# Patient Record
Sex: Female | Born: 1935
Health system: Southern US, Community
[De-identification: ages and names within clinical notes are randomized; demographics above are authoritative.]

## PROBLEM LIST (undated history)

## (undated) DIAGNOSIS — I10 Essential (primary) hypertension: Secondary | ICD-10-CM

## (undated) HISTORY — PX: ABDOMINAL HYSTERECTOMY: SHX81

---

## 1998-04-10 ENCOUNTER — Ambulatory Visit (HOSPITAL_COMMUNITY): Admission: RE | Admit: 1998-04-10 | Discharge: 1998-04-10 | Payer: Self-pay | Admitting: Podiatry

## 1998-11-13 ENCOUNTER — Encounter: Payer: Self-pay | Admitting: Gastroenterology

## 1998-11-13 ENCOUNTER — Ambulatory Visit (HOSPITAL_COMMUNITY): Admission: RE | Admit: 1998-11-13 | Discharge: 1998-11-13 | Payer: Self-pay | Admitting: Gastroenterology

## 1999-07-19 ENCOUNTER — Other Ambulatory Visit: Admission: RE | Admit: 1999-07-19 | Discharge: 1999-07-19 | Payer: Self-pay | Admitting: Obstetrics and Gynecology

## 2000-02-15 ENCOUNTER — Ambulatory Visit (HOSPITAL_COMMUNITY): Admission: RE | Admit: 2000-02-15 | Discharge: 2000-02-15 | Payer: Self-pay

## 2000-02-15 ENCOUNTER — Encounter: Payer: Self-pay | Admitting: Gastroenterology

## 2001-09-24 ENCOUNTER — Ambulatory Visit (HOSPITAL_COMMUNITY): Admission: RE | Admit: 2001-09-24 | Discharge: 2001-09-24 | Payer: Self-pay | Admitting: Gastroenterology

## 2001-09-24 ENCOUNTER — Encounter: Payer: Self-pay | Admitting: Obstetrics and Gynecology

## 2001-10-20 ENCOUNTER — Other Ambulatory Visit: Admission: RE | Admit: 2001-10-20 | Discharge: 2001-10-20 | Payer: Self-pay | Admitting: Obstetrics and Gynecology

## 2002-10-29 ENCOUNTER — Encounter: Payer: Self-pay | Admitting: Obstetrics and Gynecology

## 2002-10-29 ENCOUNTER — Ambulatory Visit (HOSPITAL_COMMUNITY): Admission: RE | Admit: 2002-10-29 | Discharge: 2002-10-29 | Payer: Self-pay | Admitting: Obstetrics and Gynecology

## 2003-12-01 ENCOUNTER — Ambulatory Visit (HOSPITAL_COMMUNITY): Admission: RE | Admit: 2003-12-01 | Discharge: 2003-12-01 | Payer: Self-pay | Admitting: Obstetrics and Gynecology

## 2004-12-05 ENCOUNTER — Ambulatory Visit (HOSPITAL_COMMUNITY): Admission: RE | Admit: 2004-12-05 | Discharge: 2004-12-05 | Payer: Self-pay | Admitting: Obstetrics and Gynecology

## 2005-12-25 ENCOUNTER — Ambulatory Visit (HOSPITAL_COMMUNITY): Admission: RE | Admit: 2005-12-25 | Discharge: 2005-12-25 | Payer: Self-pay | Admitting: Obstetrics and Gynecology

## 2006-01-15 ENCOUNTER — Encounter: Admission: RE | Admit: 2006-01-15 | Discharge: 2006-01-15 | Payer: Self-pay | Admitting: Internal Medicine

## 2007-03-09 ENCOUNTER — Ambulatory Visit (HOSPITAL_COMMUNITY): Admission: RE | Admit: 2007-03-09 | Discharge: 2007-03-09 | Payer: Self-pay | Admitting: Obstetrics and Gynecology

## 2008-03-28 ENCOUNTER — Ambulatory Visit (HOSPITAL_COMMUNITY): Admission: RE | Admit: 2008-03-28 | Discharge: 2008-03-28 | Payer: Self-pay | Admitting: Obstetrics and Gynecology

## 2009-03-30 ENCOUNTER — Ambulatory Visit (HOSPITAL_COMMUNITY): Admission: RE | Admit: 2009-03-30 | Discharge: 2009-03-30 | Payer: Self-pay | Admitting: Obstetrics and Gynecology

## 2009-04-06 ENCOUNTER — Encounter: Admission: RE | Admit: 2009-04-06 | Discharge: 2009-04-06 | Payer: Self-pay | Admitting: Obstetrics and Gynecology

## 2010-05-17 ENCOUNTER — Ambulatory Visit (HOSPITAL_COMMUNITY): Admission: RE | Admit: 2010-05-17 | Discharge: 2010-05-17 | Payer: Self-pay | Admitting: Obstetrics and Gynecology

## 2011-01-20 ENCOUNTER — Encounter: Payer: Self-pay | Admitting: Internal Medicine

## 2011-01-20 ENCOUNTER — Encounter: Payer: Self-pay | Admitting: Obstetrics and Gynecology

## 2011-05-06 ENCOUNTER — Other Ambulatory Visit (HOSPITAL_COMMUNITY): Payer: Self-pay | Admitting: Obstetrics and Gynecology

## 2011-05-06 DIAGNOSIS — Z1231 Encounter for screening mammogram for malignant neoplasm of breast: Secondary | ICD-10-CM

## 2011-05-20 ENCOUNTER — Ambulatory Visit (HOSPITAL_COMMUNITY)
Admission: RE | Admit: 2011-05-20 | Discharge: 2011-05-20 | Disposition: A | Discharge status: Home / Self Care | Payer: PRIVATE HEALTH INSURANCE | Source: Ambulatory Visit | Attending: Obstetrics and Gynecology | Admitting: Obstetrics and Gynecology

## 2011-05-20 DIAGNOSIS — Z1231 Encounter for screening mammogram for malignant neoplasm of breast: Secondary | ICD-10-CM | POA: Insufficient documentation

## 2011-05-22 ENCOUNTER — Other Ambulatory Visit: Payer: Self-pay | Admitting: Internal Medicine

## 2011-05-22 DIAGNOSIS — N6320 Unspecified lump in the left breast, unspecified quadrant: Secondary | ICD-10-CM

## 2011-05-28 ENCOUNTER — Ambulatory Visit
Admission: RE | Admit: 2011-05-28 | Discharge: 2011-05-28 | Disposition: A | Discharge status: Home / Self Care | Payer: Medicare Other | Source: Ambulatory Visit | Attending: Internal Medicine | Admitting: Internal Medicine

## 2011-05-28 DIAGNOSIS — N6320 Unspecified lump in the left breast, unspecified quadrant: Secondary | ICD-10-CM

## 2011-07-23 ENCOUNTER — Ambulatory Visit (INDEPENDENT_AMBULATORY_CARE_PROVIDER_SITE_OTHER): Payer: Medicare Other | Admitting: Sports Medicine

## 2011-07-23 ENCOUNTER — Encounter: Payer: Self-pay | Admitting: Sports Medicine

## 2011-07-23 VITALS — BP 125/82 | HR 67 | Ht 64.0 in | Wt 108.0 lb

## 2011-07-23 DIAGNOSIS — M79673 Pain in unspecified foot: Secondary | ICD-10-CM | POA: Insufficient documentation

## 2011-07-23 DIAGNOSIS — M79609 Pain in unspecified limb: Secondary | ICD-10-CM

## 2011-07-23 NOTE — Patient Instructions (Signed)
Great to meet you, You have an arch strain. Arch straps. Insoles. Ice the area 20 mins 3x a day. Do heel lifts as we discussed after you return from [place], 15 reps each with toes out, toes, neutral, and toes in.  Do this once daily. Come back to see [place] if no better after 1 month of your rehab exercises. Drs. Fields and [employer]

## 2011-07-23 NOTE — Assessment & Plan Note (Signed)
Arch straps. Sports insoles into both shoes. She will ice after walking. Rehabilitation exercises as below in the instructions to be started when the patient returns from [place]. We will see her back on an as-needed basis.

## 2011-07-23 NOTE — Progress Notes (Signed)
  Subjective:    Patient ID: Hannah Sims, female    DOB: 1936-01-07, 75 y.o.   MRN: [id number]  HPI Hannah Sims is a prolific 24 mile per week walker who comes to see [place] for right foot pain for approximately 3 weeks now. She notes that 3 weeks ago she went [place] the beach. She did not have immediate pain but over the next 2-3 weeks the pain developed both just anterior to her heel as well as in the middle of her arch medially. She denies any known trauma, however the pain is much worse with walking. She notes that icing after the walks are not all that helpful. She notes no other change in training, environment, walking surface, or footwear.   Review of Systems    thoroughly reviewed and negative except as above in history of present illness. Objective:   Physical Exam    general: Well-developed well-nourished Caucasian female. Right Ankle: No visible erythema or swelling. Range of motion is full in all directions. Strength is 5/5 in all directions. Stable lateral and medial ligaments; squeeze test and kleiger test unremarkable; Talar dome nontender; No pain at base of 5th MT; No tenderness over cuboid; No tenderness on posterior aspects of lateral and medial malleolus No sign of peroneal tendon subluxations or tenderness to palpation Negative tarsal tunnel tinel's Able to walk 4 steps. She does have some tenderness at the calcaneal insertion of the plantar fascia, as well as in the middle of the arch over the navicular. Pes cavus noted. There was no breakdown of the transverse arch.  Musculoskeletal ultrasound: We imaged the plantar fascia from its origin to its insertion. We did note some edema deep to the quadratus plantae muscle. The plantar fascia itself was of normal thickness and was comparable to the lateral side. There was no increased Doppler flow at the calcaneus or navicular. Images saved  Assessment & Plan:

## 2012-01-20 DIAGNOSIS — L570 Actinic keratosis: Secondary | ICD-10-CM | POA: Diagnosis not present

## 2012-06-04 ENCOUNTER — Encounter: Payer: Self-pay | Admitting: Sports Medicine

## 2012-06-04 ENCOUNTER — Ambulatory Visit (INDEPENDENT_AMBULATORY_CARE_PROVIDER_SITE_OTHER): Payer: Medicare Other | Admitting: Sports Medicine

## 2012-06-04 VITALS — BP 120/80 | Ht 64.0 in | Wt 110.0 lb

## 2012-06-04 DIAGNOSIS — M545 Low back pain, unspecified: Secondary | ICD-10-CM | POA: Diagnosis not present

## 2012-06-04 DIAGNOSIS — M19049 Primary osteoarthritis, unspecified hand: Secondary | ICD-10-CM | POA: Diagnosis not present

## 2012-06-04 DIAGNOSIS — M542 Cervicalgia: Secondary | ICD-10-CM

## 2012-06-04 MED ORDER — TRAMADOL HCL 50 MG PO TABS: 50.0000 mg | ORAL_TABLET | Freq: Three times a day (TID) | ORAL | Status: AC | PRN

## 2012-06-04 NOTE — Patient Instructions (Signed)
Back extension exercises superman, and bridge. Neck isometric strengthening. Tramadol 50 mg oral every 8 hours as needed.

## 2012-06-04 NOTE — Progress Notes (Signed)
  Subjective:    Patient ID: Hannah Sims, female    DOB: August 09, 1936, 76 y.o.   MRN: [id number]  HPI  Ms. Klos is coming complaining of LBP for 1 year or more, no radiated, sharp, no numbness or tingling, no injury to her LBP. Neck pain, 2/10, on and off, sharp, no radiated, no numbness and tingling. No injury to her neck  Left hand  CMC DJD, chronic pain on the [facility] joint, sharp,2/10, .  No injury to her CMC. She is doing yoga and working out in [employer] Also walking daily for exercises. She is taking vit d, Ca, glucosamine and fish oil  Patient Active Problem List  Diagnoses  . Arch strain     Current Outpatient Prescriptions on File Prior to Visit  Medication Sig Dispense Refill  . calcium carbonate 200 MG capsule Take 250 mg by mouth daily.        Marland Kitchen lisinopril-hydrochlorothiazide (PRINZIDE,ZESTORETIC) 10-12.5 MG per tablet Take 10-12.5 mg by mouth daily.      . Multiple Vitamin (MULTIVITAMIN) tablet Take 1 tablet by mouth daily.        Marland Kitchen VITAMIN D, CHOLECALCIFEROL, PO Take 1 tablet by mouth daily.        Marland Kitchen VIVELLE-DOT 0.025 MG/24HR Place 0.025 mg onto the skin 2 (two) times a week.       No Known Allergies   Review of Systems  Constitutional: Negative for fever, chills, diaphoresis and fatigue.  Musculoskeletal: Positive for back pain. Negative for joint swelling, arthralgias and gait problem.  Neurological: Negative for weakness and numbness.       Objective:   Physical Exam  Constitutional: She appears well-developed and well-nourished.       BP 120/80  Ht 5\' 4"  (1.626 m)  Wt 110 lb (49.896 kg)  BMI 18.88 kg/m2   Neck: Normal range of motion. Neck supple. No thyromegaly present.       Neck  with intact skin. No swelling, no hematomas. FROM  for flexion, extension, rotation and lateralization with pain at the extremes.  Tenderness to palpation in the posterior aspect of her neck. Strength 5/5 for shoulder flexion and extension, 5/5 for elbow flexion and  extension, 5/5 for wrist flexion and extension B/L. DTR biceps,triceps and brachioradialis II/IV B/L. Sensation intact distally B/L.    Pulmonary/Chest: Effort normal.  Musculoskeletal:       Low back with intact skin. No swelling, no hematomas. FROM for flexion, extension, rotation and lateralization. Pain at the extremes of flexion and extension. No Tenderness to palpation on SI joint area B/L. Faber test negative for SI joint pain. Straight leg raise negative B/L. Strength 5/5 for hip flexion and extension, 5/5 for knee flexion and extension, 5/5 for ankle plantar and dorsal flexion B/L. DTR patellar and achilles II/IV B/L Sensation intact distally B/L. No leg discrepancy.           Assessment & Plan:   1. Low back pain   2. Neck pain   3. CMC arthritis    Back extension exercises superman, and bridge. Neck isometric strengthening. Tramadol 50 mg oral every 8 hours as needed. F/U prn

## 2012-07-30 DIAGNOSIS — Z0000 Encounter for general adult medical examination without abnormal findings: Secondary | ICD-10-CM | POA: Diagnosis not present

## 2012-07-30 DIAGNOSIS — M949 Disorder of cartilage, unspecified: Secondary | ICD-10-CM | POA: Diagnosis not present

## 2012-07-30 DIAGNOSIS — Z1331 Encounter for screening for depression: Secondary | ICD-10-CM | POA: Diagnosis not present

## 2012-07-30 DIAGNOSIS — I10 Essential (primary) hypertension: Secondary | ICD-10-CM | POA: Diagnosis not present

## 2012-08-10 ENCOUNTER — Other Ambulatory Visit: Payer: Self-pay | Admitting: Internal Medicine

## 2012-08-10 DIAGNOSIS — Z1231 Encounter for screening mammogram for malignant neoplasm of breast: Secondary | ICD-10-CM

## 2012-08-27 ENCOUNTER — Ambulatory Visit
Admission: RE | Admit: 2012-08-27 | Discharge: 2012-08-27 | Disposition: A | Discharge status: Home / Self Care | Payer: PRIVATE HEALTH INSURANCE | Source: Ambulatory Visit | Attending: Internal Medicine | Admitting: Internal Medicine

## 2012-08-27 DIAGNOSIS — Z1231 Encounter for screening mammogram for malignant neoplasm of breast: Secondary | ICD-10-CM

## 2012-10-02 DIAGNOSIS — Z23 Encounter for immunization: Secondary | ICD-10-CM | POA: Diagnosis not present

## 2012-10-06 DIAGNOSIS — H1044 Vernal conjunctivitis: Secondary | ICD-10-CM | POA: Diagnosis not present

## 2012-12-28 DIAGNOSIS — L408 Other psoriasis: Secondary | ICD-10-CM | POA: Diagnosis not present

## 2012-12-28 DIAGNOSIS — Z85828 Personal history of other malignant neoplasm of skin: Secondary | ICD-10-CM | POA: Diagnosis not present

## 2013-02-22 DIAGNOSIS — L259 Unspecified contact dermatitis, unspecified cause: Secondary | ICD-10-CM | POA: Diagnosis not present

## 2013-02-22 DIAGNOSIS — L570 Actinic keratosis: Secondary | ICD-10-CM | POA: Diagnosis not present

## 2013-02-22 DIAGNOSIS — L719 Rosacea, unspecified: Secondary | ICD-10-CM | POA: Diagnosis not present

## 2013-02-22 DIAGNOSIS — Z85828 Personal history of other malignant neoplasm of skin: Secondary | ICD-10-CM | POA: Diagnosis not present

## 2013-05-11 DIAGNOSIS — L820 Inflamed seborrheic keratosis: Secondary | ICD-10-CM | POA: Diagnosis not present

## 2013-05-11 DIAGNOSIS — Z85828 Personal history of other malignant neoplasm of skin: Secondary | ICD-10-CM | POA: Diagnosis not present

## 2013-05-11 DIAGNOSIS — L570 Actinic keratosis: Secondary | ICD-10-CM | POA: Diagnosis not present

## 2013-05-11 DIAGNOSIS — L719 Rosacea, unspecified: Secondary | ICD-10-CM | POA: Diagnosis not present

## 2013-07-29 ENCOUNTER — Other Ambulatory Visit: Payer: Self-pay

## 2013-07-29 DIAGNOSIS — Z1231 Encounter for screening mammogram for malignant neoplasm of breast: Secondary | ICD-10-CM

## 2013-08-03 DIAGNOSIS — I10 Essential (primary) hypertension: Secondary | ICD-10-CM | POA: Diagnosis not present

## 2013-08-03 DIAGNOSIS — M899 Disorder of bone, unspecified: Secondary | ICD-10-CM | POA: Diagnosis not present

## 2013-08-03 DIAGNOSIS — Z23 Encounter for immunization: Secondary | ICD-10-CM | POA: Diagnosis not present

## 2013-08-03 DIAGNOSIS — M949 Disorder of cartilage, unspecified: Secondary | ICD-10-CM | POA: Diagnosis not present

## 2013-08-03 DIAGNOSIS — Z0000 Encounter for general adult medical examination without abnormal findings: Secondary | ICD-10-CM | POA: Diagnosis not present

## 2013-08-31 ENCOUNTER — Ambulatory Visit: Payer: PRIVATE HEALTH INSURANCE

## 2013-09-21 ENCOUNTER — Ambulatory Visit
Admission: RE | Admit: 2013-09-21 | Discharge: 2013-09-21 | Disposition: A | Discharge status: Home / Self Care | Payer: PRIVATE HEALTH INSURANCE | Source: Ambulatory Visit

## 2013-09-21 DIAGNOSIS — Z1231 Encounter for screening mammogram for malignant neoplasm of breast: Secondary | ICD-10-CM

## 2013-09-27 DIAGNOSIS — Z23 Encounter for immunization: Secondary | ICD-10-CM | POA: Diagnosis not present

## 2014-01-04 DIAGNOSIS — L819 Disorder of pigmentation, unspecified: Secondary | ICD-10-CM | POA: Diagnosis not present

## 2014-01-04 DIAGNOSIS — Z85828 Personal history of other malignant neoplasm of skin: Secondary | ICD-10-CM | POA: Diagnosis not present

## 2014-01-04 DIAGNOSIS — L821 Other seborrheic keratosis: Secondary | ICD-10-CM | POA: Diagnosis not present

## 2014-01-04 DIAGNOSIS — D1801 Hemangioma of skin and subcutaneous tissue: Secondary | ICD-10-CM | POA: Diagnosis not present

## 2014-04-18 DIAGNOSIS — R498 Other voice and resonance disorders: Secondary | ICD-10-CM | POA: Diagnosis not present

## 2014-06-02 DIAGNOSIS — H1044 Vernal conjunctivitis: Secondary | ICD-10-CM | POA: Diagnosis not present

## 2014-09-02 DIAGNOSIS — Z0000 Encounter for general adult medical examination without abnormal findings: Secondary | ICD-10-CM | POA: Diagnosis not present

## 2014-09-02 DIAGNOSIS — R636 Underweight: Secondary | ICD-10-CM | POA: Diagnosis not present

## 2014-09-02 DIAGNOSIS — G4700 Insomnia, unspecified: Secondary | ICD-10-CM | POA: Diagnosis not present

## 2014-09-02 DIAGNOSIS — Z1331 Encounter for screening for depression: Secondary | ICD-10-CM | POA: Diagnosis not present

## 2014-09-02 DIAGNOSIS — I10 Essential (primary) hypertension: Secondary | ICD-10-CM | POA: Diagnosis not present

## 2014-09-02 DIAGNOSIS — Z23 Encounter for immunization: Secondary | ICD-10-CM | POA: Diagnosis not present

## 2014-09-08 ENCOUNTER — Other Ambulatory Visit: Payer: Self-pay

## 2014-09-08 DIAGNOSIS — Z1231 Encounter for screening mammogram for malignant neoplasm of breast: Secondary | ICD-10-CM

## 2014-09-22 ENCOUNTER — Ambulatory Visit
Admission: RE | Admit: 2014-09-22 | Discharge: 2014-09-22 | Disposition: A | Discharge status: Home / Self Care | Payer: Managed Care, Other (non HMO) | Source: Ambulatory Visit

## 2014-09-22 DIAGNOSIS — Z1231 Encounter for screening mammogram for malignant neoplasm of breast: Secondary | ICD-10-CM

## 2014-11-03 DIAGNOSIS — M779 Enthesopathy, unspecified: Secondary | ICD-10-CM | POA: Diagnosis not present

## 2014-12-05 DIAGNOSIS — M898X7 Other specified disorders of bone, ankle and foot: Secondary | ICD-10-CM | POA: Diagnosis not present

## 2015-01-05 DIAGNOSIS — L409 Psoriasis, unspecified: Secondary | ICD-10-CM | POA: Diagnosis not present

## 2015-01-05 DIAGNOSIS — L821 Other seborrheic keratosis: Secondary | ICD-10-CM | POA: Diagnosis not present

## 2015-01-05 DIAGNOSIS — B079 Viral wart, unspecified: Secondary | ICD-10-CM | POA: Diagnosis not present

## 2015-01-05 DIAGNOSIS — Z85828 Personal history of other malignant neoplasm of skin: Secondary | ICD-10-CM | POA: Diagnosis not present

## 2015-02-27 DIAGNOSIS — H2513 Age-related nuclear cataract, bilateral: Secondary | ICD-10-CM | POA: Diagnosis not present

## 2015-09-05 ENCOUNTER — Other Ambulatory Visit: Payer: Self-pay

## 2015-09-05 DIAGNOSIS — Z1389 Encounter for screening for other disorder: Secondary | ICD-10-CM | POA: Diagnosis not present

## 2015-09-05 DIAGNOSIS — E559 Vitamin D deficiency, unspecified: Secondary | ICD-10-CM | POA: Diagnosis not present

## 2015-09-05 DIAGNOSIS — I10 Essential (primary) hypertension: Secondary | ICD-10-CM | POA: Diagnosis not present

## 2015-09-05 DIAGNOSIS — Z23 Encounter for immunization: Secondary | ICD-10-CM | POA: Diagnosis not present

## 2015-09-05 DIAGNOSIS — Z1231 Encounter for screening mammogram for malignant neoplasm of breast: Secondary | ICD-10-CM

## 2015-09-05 DIAGNOSIS — M8580 Other specified disorders of bone density and structure, unspecified site: Secondary | ICD-10-CM | POA: Diagnosis not present

## 2015-09-05 DIAGNOSIS — R636 Underweight: Secondary | ICD-10-CM | POA: Diagnosis not present

## 2015-09-26 ENCOUNTER — Ambulatory Visit
Admission: RE | Admit: 2015-09-26 | Discharge: 2015-09-26 | Disposition: A | Discharge status: Home / Self Care | Payer: Medicare Other | Source: Ambulatory Visit

## 2015-09-26 DIAGNOSIS — Z1231 Encounter for screening mammogram for malignant neoplasm of breast: Secondary | ICD-10-CM

## 2015-10-12 DIAGNOSIS — M859 Disorder of bone density and structure, unspecified: Secondary | ICD-10-CM | POA: Diagnosis not present

## 2015-10-12 DIAGNOSIS — M8589 Other specified disorders of bone density and structure, multiple sites: Secondary | ICD-10-CM | POA: Diagnosis not present

## 2015-10-23 DIAGNOSIS — S63602A Unspecified sprain of left thumb, initial encounter: Secondary | ICD-10-CM | POA: Diagnosis not present

## 2015-10-23 DIAGNOSIS — M19042 Primary osteoarthritis, left hand: Secondary | ICD-10-CM | POA: Diagnosis not present

## 2015-10-23 DIAGNOSIS — M7989 Other specified soft tissue disorders: Secondary | ICD-10-CM | POA: Diagnosis not present

## 2015-10-23 DIAGNOSIS — M79645 Pain in left finger(s): Secondary | ICD-10-CM | POA: Diagnosis not present

## 2015-10-27 DIAGNOSIS — M79641 Pain in right hand: Secondary | ICD-10-CM | POA: Diagnosis not present

## 2015-10-27 DIAGNOSIS — S63602D Unspecified sprain of left thumb, subsequent encounter: Secondary | ICD-10-CM | POA: Diagnosis not present

## 2015-10-27 DIAGNOSIS — S62515D Nondisplaced fracture of proximal phalanx of left thumb, subsequent encounter for fracture with routine healing: Secondary | ICD-10-CM | POA: Diagnosis not present

## 2015-11-13 DIAGNOSIS — M79645 Pain in left finger(s): Secondary | ICD-10-CM | POA: Diagnosis not present

## 2015-11-13 DIAGNOSIS — M1812 Unilateral primary osteoarthritis of first carpometacarpal joint, left hand: Secondary | ICD-10-CM | POA: Diagnosis not present

## 2015-11-13 DIAGNOSIS — S63602D Unspecified sprain of left thumb, subsequent encounter: Secondary | ICD-10-CM | POA: Diagnosis not present

## 2015-11-13 DIAGNOSIS — S62515D Nondisplaced fracture of proximal phalanx of left thumb, subsequent encounter for fracture with routine healing: Secondary | ICD-10-CM | POA: Diagnosis not present

## 2016-02-09 DIAGNOSIS — Z85828 Personal history of other malignant neoplasm of skin: Secondary | ICD-10-CM | POA: Diagnosis not present

## 2016-02-09 DIAGNOSIS — L570 Actinic keratosis: Secondary | ICD-10-CM | POA: Diagnosis not present

## 2016-02-09 DIAGNOSIS — L821 Other seborrheic keratosis: Secondary | ICD-10-CM | POA: Diagnosis not present

## 2016-02-09 DIAGNOSIS — D1801 Hemangioma of skin and subcutaneous tissue: Secondary | ICD-10-CM | POA: Diagnosis not present

## 2016-02-09 DIAGNOSIS — L400 Psoriasis vulgaris: Secondary | ICD-10-CM | POA: Diagnosis not present

## 2016-02-09 DIAGNOSIS — D2239 Melanocytic nevi of other parts of face: Secondary | ICD-10-CM | POA: Diagnosis not present

## 2016-03-18 DIAGNOSIS — H1011 Acute atopic conjunctivitis, right eye: Secondary | ICD-10-CM | POA: Diagnosis not present

## 2016-04-30 DIAGNOSIS — H2513 Age-related nuclear cataract, bilateral: Secondary | ICD-10-CM | POA: Diagnosis not present

## 2016-06-25 ENCOUNTER — Encounter: Payer: Self-pay | Admitting: Sports Medicine

## 2016-06-25 ENCOUNTER — Ambulatory Visit
Admission: RE | Admit: 2016-06-25 | Discharge: 2016-06-25 | Disposition: A | Discharge status: Home / Self Care | Payer: Medicare Other | Source: Ambulatory Visit | Attending: Sports Medicine | Admitting: Sports Medicine

## 2016-06-25 ENCOUNTER — Ambulatory Visit (INDEPENDENT_AMBULATORY_CARE_PROVIDER_SITE_OTHER): Payer: Managed Care, Other (non HMO) | Admitting: Sports Medicine

## 2016-06-25 VITALS — BP 121/82 | HR 62 | Ht 64.5 in | Wt 101.0 lb

## 2016-06-25 DIAGNOSIS — M1712 Unilateral primary osteoarthritis, left knee: Secondary | ICD-10-CM | POA: Diagnosis not present

## 2016-06-25 DIAGNOSIS — M47816 Spondylosis without myelopathy or radiculopathy, lumbar region: Secondary | ICD-10-CM | POA: Diagnosis not present

## 2016-06-25 DIAGNOSIS — M8580 Other specified disorders of bone density and structure, unspecified site: Secondary | ICD-10-CM | POA: Diagnosis not present

## 2016-06-25 NOTE — Assessment & Plan Note (Signed)
She has excellent strength  I think she is a good candidate for the OA study with [facility] We will enroll her in that  Avoid deep knee bend and steps  Reevaluate after 3 mos of supervised PT in study

## 2016-06-25 NOTE — Progress Notes (Signed)
Patient ID: SVANA WETTSTEIN, female   DOB: [birth year], 80 y.o.   MRN: [id number]  CC; Left Knee pain  Patient enters with some left knee pain Along medial aspect of knee she exercises with walking up to about 14000 steps per day No injury Has been present past few months No locking No giving way Sometimes feels a pain on steps or if walking too fast  Past Hx Periodic low back pain - still bothers her Osteopenia and ? 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 Palpation normal with no warmth or joint line tenderness or patellar tenderness or condyle tenderness. ROM normal in flexion and extension and lower leg rotation. Ligaments with solid consistent endpoints including ACL, PCL, LCL, MCL. Clicking and some medial joint line pain on Mcmurray's and provocative meniscal tests. Non painful patellar compression. Patellar and quadriceps tendons unremarkable. Hamstring and quadriceps strength is normal. Abduction strength is good  [place] Small effusion in lateral SPP Deg. Meniscal change with loose body med meniscus/ TT to sonopalpation Only mild joint space narrowing also noted laterally QT and PT normal

## 2016-06-25 NOTE — Patient Instructions (Signed)
Left knee pain Ultrasound you have degeneration of the medial meniscus and a small loose calcified piece of cartilage This causes  some pain along her medial knee You do have very mild osteoarthritis   excellent knee for anyone in their 26s  Osteopenia Remember the 90 sec hop test 3 sessions of 30 secs with 1 minute rest in between Increases bone density as much as walking 2 miles  Low Back Let's check to see the degree of vertebral arthritis on Xray  You are an excellent candidate for the knee research project with [facility]

## 2016-08-27 DIAGNOSIS — Z85828 Personal history of other malignant neoplasm of skin: Secondary | ICD-10-CM | POA: Diagnosis not present

## 2016-08-27 DIAGNOSIS — L821 Other seborrheic keratosis: Secondary | ICD-10-CM | POA: Diagnosis not present

## 2016-08-27 DIAGNOSIS — L820 Inflamed seborrheic keratosis: Secondary | ICD-10-CM | POA: Diagnosis not present

## 2016-08-27 DIAGNOSIS — L400 Psoriasis vulgaris: Secondary | ICD-10-CM | POA: Diagnosis not present

## 2016-09-26 DIAGNOSIS — I10 Essential (primary) hypertension: Secondary | ICD-10-CM | POA: Diagnosis not present

## 2016-09-26 DIAGNOSIS — Z1389 Encounter for screening for other disorder: Secondary | ICD-10-CM | POA: Diagnosis not present

## 2016-09-26 DIAGNOSIS — M8580 Other specified disorders of bone density and structure, unspecified site: Secondary | ICD-10-CM | POA: Diagnosis not present

## 2016-09-26 DIAGNOSIS — R636 Underweight: Secondary | ICD-10-CM | POA: Diagnosis not present

## 2016-09-26 DIAGNOSIS — F5101 Primary insomnia: Secondary | ICD-10-CM | POA: Diagnosis not present

## 2016-09-26 DIAGNOSIS — Z23 Encounter for immunization: Secondary | ICD-10-CM | POA: Diagnosis not present

## 2016-09-26 DIAGNOSIS — Z0000 Encounter for general adult medical examination without abnormal findings: Secondary | ICD-10-CM | POA: Diagnosis not present

## 2016-10-01 ENCOUNTER — Other Ambulatory Visit: Payer: Self-pay | Admitting: Internal Medicine

## 2016-10-01 DIAGNOSIS — Z1231 Encounter for screening mammogram for malignant neoplasm of breast: Secondary | ICD-10-CM

## 2016-10-08 ENCOUNTER — Ambulatory Visit: Payer: Medicare Other

## 2016-10-15 ENCOUNTER — Ambulatory Visit
Admission: RE | Admit: 2016-10-15 | Discharge: 2016-10-15 | Disposition: A | Discharge status: Home / Self Care | Payer: Medicare Other | Source: Ambulatory Visit | Attending: Internal Medicine | Admitting: Internal Medicine

## 2016-10-15 DIAGNOSIS — Z1231 Encounter for screening mammogram for malignant neoplasm of breast: Secondary | ICD-10-CM

## 2017-04-11 DIAGNOSIS — L814 Other melanin hyperpigmentation: Secondary | ICD-10-CM | POA: Diagnosis not present

## 2017-04-11 DIAGNOSIS — Z85828 Personal history of other malignant neoplasm of skin: Secondary | ICD-10-CM | POA: Diagnosis not present

## 2017-04-11 DIAGNOSIS — L400 Psoriasis vulgaris: Secondary | ICD-10-CM | POA: Diagnosis not present

## 2017-04-11 DIAGNOSIS — I8391 Asymptomatic varicose veins of right lower extremity: Secondary | ICD-10-CM | POA: Diagnosis not present

## 2017-04-11 DIAGNOSIS — L821 Other seborrheic keratosis: Secondary | ICD-10-CM | POA: Diagnosis not present

## 2017-04-11 DIAGNOSIS — D2271 Melanocytic nevi of right lower limb, including hip: Secondary | ICD-10-CM | POA: Diagnosis not present

## 2017-04-11 DIAGNOSIS — D1801 Hemangioma of skin and subcutaneous tissue: Secondary | ICD-10-CM | POA: Diagnosis not present

## 2017-04-11 DIAGNOSIS — D2239 Melanocytic nevi of other parts of face: Secondary | ICD-10-CM | POA: Diagnosis not present

## 2017-05-19 DIAGNOSIS — H1013 Acute atopic conjunctivitis, bilateral: Secondary | ICD-10-CM | POA: Diagnosis not present

## 2017-05-26 DIAGNOSIS — S90562A Insect bite (nonvenomous), left ankle, initial encounter: Secondary | ICD-10-CM | POA: Diagnosis not present

## 2017-08-06 DIAGNOSIS — I10 Essential (primary) hypertension: Secondary | ICD-10-CM | POA: Diagnosis not present

## 2017-08-06 DIAGNOSIS — M791 Myalgia: Secondary | ICD-10-CM | POA: Diagnosis not present

## 2017-08-06 DIAGNOSIS — M159 Polyosteoarthritis, unspecified: Secondary | ICD-10-CM | POA: Diagnosis not present

## 2017-09-29 DIAGNOSIS — I10 Essential (primary) hypertension: Secondary | ICD-10-CM | POA: Diagnosis not present

## 2017-09-29 DIAGNOSIS — Z23 Encounter for immunization: Secondary | ICD-10-CM | POA: Diagnosis not present

## 2017-09-29 DIAGNOSIS — Z1389 Encounter for screening for other disorder: Secondary | ICD-10-CM | POA: Diagnosis not present

## 2017-09-29 DIAGNOSIS — F5101 Primary insomnia: Secondary | ICD-10-CM | POA: Diagnosis not present

## 2017-09-29 DIAGNOSIS — Z0000 Encounter for general adult medical examination without abnormal findings: Secondary | ICD-10-CM | POA: Diagnosis not present

## 2017-09-29 DIAGNOSIS — R636 Underweight: Secondary | ICD-10-CM | POA: Diagnosis not present

## 2017-10-06 DIAGNOSIS — H2513 Age-related nuclear cataract, bilateral: Secondary | ICD-10-CM | POA: Diagnosis not present

## 2017-12-11 DIAGNOSIS — F5101 Primary insomnia: Secondary | ICD-10-CM | POA: Diagnosis not present

## 2018-01-05 DIAGNOSIS — H0012 Chalazion right lower eyelid: Secondary | ICD-10-CM | POA: Diagnosis not present

## 2018-01-13 DIAGNOSIS — L814 Other melanin hyperpigmentation: Secondary | ICD-10-CM | POA: Diagnosis not present

## 2018-01-13 DIAGNOSIS — L570 Actinic keratosis: Secondary | ICD-10-CM | POA: Diagnosis not present

## 2018-01-13 DIAGNOSIS — Z85828 Personal history of other malignant neoplasm of skin: Secondary | ICD-10-CM | POA: Diagnosis not present

## 2018-01-13 DIAGNOSIS — L821 Other seborrheic keratosis: Secondary | ICD-10-CM | POA: Diagnosis not present

## 2018-01-16 DIAGNOSIS — H0012 Chalazion right lower eyelid: Secondary | ICD-10-CM | POA: Diagnosis not present

## 2018-01-23 ENCOUNTER — Other Ambulatory Visit: Payer: Self-pay | Admitting: Internal Medicine

## 2018-01-23 ENCOUNTER — Ambulatory Visit
Admission: RE | Admit: 2018-01-23 | Discharge: 2018-01-23 | Disposition: A | Discharge status: Home / Self Care | Payer: Medicare Other | Source: Ambulatory Visit | Attending: Internal Medicine | Admitting: Internal Medicine

## 2018-01-23 DIAGNOSIS — R05 Cough: Secondary | ICD-10-CM | POA: Diagnosis not present

## 2018-01-23 DIAGNOSIS — R5383 Other fatigue: Secondary | ICD-10-CM | POA: Diagnosis not present

## 2018-01-23 DIAGNOSIS — J209 Acute bronchitis, unspecified: Secondary | ICD-10-CM | POA: Diagnosis not present

## 2018-01-23 DIAGNOSIS — I10 Essential (primary) hypertension: Secondary | ICD-10-CM | POA: Diagnosis not present

## 2018-01-23 DIAGNOSIS — J40 Bronchitis, not specified as acute or chronic: Secondary | ICD-10-CM

## 2018-01-23 DIAGNOSIS — R350 Frequency of micturition: Secondary | ICD-10-CM | POA: Diagnosis not present

## 2018-01-30 DIAGNOSIS — J181 Lobar pneumonia, unspecified organism: Secondary | ICD-10-CM | POA: Diagnosis not present

## 2018-01-30 DIAGNOSIS — E46 Unspecified protein-calorie malnutrition: Secondary | ICD-10-CM | POA: Diagnosis not present

## 2018-02-27 ENCOUNTER — Other Ambulatory Visit: Payer: Self-pay | Admitting: Internal Medicine

## 2018-02-27 ENCOUNTER — Ambulatory Visit
Admission: RE | Admit: 2018-02-27 | Discharge: 2018-02-27 | Disposition: A | Discharge status: Home / Self Care | Payer: Medicare Other | Source: Ambulatory Visit | Attending: Internal Medicine | Admitting: Internal Medicine

## 2018-02-27 DIAGNOSIS — J180 Bronchopneumonia, unspecified organism: Secondary | ICD-10-CM

## 2018-02-27 DIAGNOSIS — J189 Pneumonia, unspecified organism: Secondary | ICD-10-CM | POA: Diagnosis not present

## 2018-02-27 DIAGNOSIS — E46 Unspecified protein-calorie malnutrition: Secondary | ICD-10-CM | POA: Diagnosis not present

## 2018-03-10 DIAGNOSIS — H2511 Age-related nuclear cataract, right eye: Secondary | ICD-10-CM | POA: Diagnosis not present

## 2018-03-10 DIAGNOSIS — H25011 Cortical age-related cataract, right eye: Secondary | ICD-10-CM | POA: Diagnosis not present

## 2018-03-10 DIAGNOSIS — H25811 Combined forms of age-related cataract, right eye: Secondary | ICD-10-CM | POA: Diagnosis not present

## 2018-03-31 DIAGNOSIS — R5383 Other fatigue: Secondary | ICD-10-CM | POA: Diagnosis not present

## 2018-03-31 DIAGNOSIS — E46 Unspecified protein-calorie malnutrition: Secondary | ICD-10-CM | POA: Diagnosis not present

## 2018-03-31 DIAGNOSIS — R630 Anorexia: Secondary | ICD-10-CM | POA: Diagnosis not present

## 2018-04-23 ENCOUNTER — Other Ambulatory Visit: Payer: Self-pay | Admitting: Internal Medicine

## 2018-04-23 ENCOUNTER — Ambulatory Visit
Admission: RE | Admit: 2018-04-23 | Discharge: 2018-04-23 | Disposition: A | Discharge status: Home / Self Care | Payer: Medicare Other | Source: Ambulatory Visit | Attending: Internal Medicine | Admitting: Internal Medicine

## 2018-04-23 DIAGNOSIS — M545 Low back pain, unspecified: Secondary | ICD-10-CM

## 2018-04-23 DIAGNOSIS — K921 Melena: Secondary | ICD-10-CM | POA: Diagnosis not present

## 2018-04-23 DIAGNOSIS — R5383 Other fatigue: Secondary | ICD-10-CM | POA: Diagnosis not present

## 2018-05-04 DIAGNOSIS — R0609 Other forms of dyspnea: Secondary | ICD-10-CM | POA: Diagnosis not present

## 2018-05-04 DIAGNOSIS — G4700 Insomnia, unspecified: Secondary | ICD-10-CM | POA: Diagnosis not present

## 2018-05-04 DIAGNOSIS — R5383 Other fatigue: Secondary | ICD-10-CM | POA: Diagnosis not present

## 2018-05-04 DIAGNOSIS — R630 Anorexia: Secondary | ICD-10-CM | POA: Diagnosis not present

## 2018-05-05 ENCOUNTER — Other Ambulatory Visit: Payer: Self-pay | Admitting: Internal Medicine

## 2018-05-05 ENCOUNTER — Other Ambulatory Visit: Payer: Self-pay

## 2018-05-05 ENCOUNTER — Encounter (HOSPITAL_COMMUNITY): Payer: Self-pay | Admitting: *Deleted

## 2018-05-05 ENCOUNTER — Ambulatory Visit (HOSPITAL_COMMUNITY): Payer: Managed Care, Other (non HMO) | Attending: Cardiovascular Disease

## 2018-05-05 DIAGNOSIS — I071 Rheumatic tricuspid insufficiency: Secondary | ICD-10-CM | POA: Diagnosis not present

## 2018-05-05 DIAGNOSIS — R0609 Other forms of dyspnea: Secondary | ICD-10-CM

## 2018-05-05 DIAGNOSIS — I119 Hypertensive heart disease without heart failure: Secondary | ICD-10-CM | POA: Insufficient documentation

## 2018-05-05 DIAGNOSIS — Z87891 Personal history of nicotine dependence: Secondary | ICD-10-CM | POA: Insufficient documentation

## 2018-05-05 NOTE — Progress Notes (Unsigned)
Patient complains of headache and some shortness of breath upon arrival for Echocardiogram.  BP taken post exam:  RT 161/111 Lt 168/110.  Results taken to Dr. Tamala Julian (DOD).  Suggests to rest, take pain meds for Headache, and limit salt intake.  Call Dr. Laurann Montana if still having symptoms in the morning.  Patient sent home per Dr. Darliss Ridgel recommendations.  Deliah Boston, RDCS

## 2018-05-06 DIAGNOSIS — R5383 Other fatigue: Secondary | ICD-10-CM | POA: Diagnosis not present

## 2018-05-06 DIAGNOSIS — R0609 Other forms of dyspnea: Secondary | ICD-10-CM | POA: Diagnosis not present

## 2018-05-07 DIAGNOSIS — M545 Low back pain: Secondary | ICD-10-CM | POA: Diagnosis not present

## 2018-05-11 DIAGNOSIS — M545 Low back pain: Secondary | ICD-10-CM | POA: Diagnosis not present

## 2018-05-14 DIAGNOSIS — M545 Low back pain: Secondary | ICD-10-CM | POA: Diagnosis not present

## 2018-05-18 DIAGNOSIS — M545 Low back pain: Secondary | ICD-10-CM | POA: Diagnosis not present

## 2018-05-21 DIAGNOSIS — M545 Low back pain: Secondary | ICD-10-CM | POA: Diagnosis not present

## 2018-05-26 DIAGNOSIS — Z85828 Personal history of other malignant neoplasm of skin: Secondary | ICD-10-CM | POA: Diagnosis not present

## 2018-05-26 DIAGNOSIS — I10 Essential (primary) hypertension: Secondary | ICD-10-CM | POA: Diagnosis not present

## 2018-05-26 DIAGNOSIS — R634 Abnormal weight loss: Secondary | ICD-10-CM | POA: Diagnosis not present

## 2018-05-26 DIAGNOSIS — D2271 Melanocytic nevi of right lower limb, including hip: Secondary | ICD-10-CM | POA: Diagnosis not present

## 2018-05-26 DIAGNOSIS — F5101 Primary insomnia: Secondary | ICD-10-CM | POA: Diagnosis not present

## 2018-05-26 DIAGNOSIS — R350 Frequency of micturition: Secondary | ICD-10-CM | POA: Diagnosis not present

## 2018-05-26 DIAGNOSIS — L400 Psoriasis vulgaris: Secondary | ICD-10-CM | POA: Diagnosis not present

## 2018-05-26 DIAGNOSIS — D225 Melanocytic nevi of trunk: Secondary | ICD-10-CM | POA: Diagnosis not present

## 2018-05-26 DIAGNOSIS — D2272 Melanocytic nevi of left lower limb, including hip: Secondary | ICD-10-CM | POA: Diagnosis not present

## 2018-05-26 DIAGNOSIS — D1801 Hemangioma of skin and subcutaneous tissue: Secondary | ICD-10-CM | POA: Diagnosis not present

## 2018-05-26 DIAGNOSIS — R251 Tremor, unspecified: Secondary | ICD-10-CM | POA: Diagnosis not present

## 2018-05-26 DIAGNOSIS — L821 Other seborrheic keratosis: Secondary | ICD-10-CM | POA: Diagnosis not present

## 2018-05-26 DIAGNOSIS — R5383 Other fatigue: Secondary | ICD-10-CM | POA: Diagnosis not present

## 2018-05-27 DIAGNOSIS — R3915 Urgency of urination: Secondary | ICD-10-CM | POA: Diagnosis not present

## 2018-05-27 DIAGNOSIS — R351 Nocturia: Secondary | ICD-10-CM | POA: Diagnosis not present

## 2018-05-27 DIAGNOSIS — R350 Frequency of micturition: Secondary | ICD-10-CM | POA: Diagnosis not present

## 2018-05-27 DIAGNOSIS — N952 Postmenopausal atrophic vaginitis: Secondary | ICD-10-CM | POA: Diagnosis not present

## 2018-05-28 DIAGNOSIS — I10 Essential (primary) hypertension: Secondary | ICD-10-CM | POA: Diagnosis not present

## 2018-05-28 DIAGNOSIS — M545 Low back pain: Secondary | ICD-10-CM | POA: Diagnosis not present

## 2018-05-28 DIAGNOSIS — R251 Tremor, unspecified: Secondary | ICD-10-CM | POA: Diagnosis not present

## 2018-06-09 DIAGNOSIS — R05 Cough: Secondary | ICD-10-CM | POA: Diagnosis not present

## 2018-06-09 DIAGNOSIS — I10 Essential (primary) hypertension: Secondary | ICD-10-CM | POA: Diagnosis not present

## 2018-06-09 DIAGNOSIS — F5101 Primary insomnia: Secondary | ICD-10-CM | POA: Diagnosis not present

## 2018-06-09 DIAGNOSIS — R634 Abnormal weight loss: Secondary | ICD-10-CM | POA: Diagnosis not present

## 2018-06-15 DIAGNOSIS — M545 Low back pain: Secondary | ICD-10-CM | POA: Diagnosis not present

## 2018-08-18 DIAGNOSIS — H25812 Combined forms of age-related cataract, left eye: Secondary | ICD-10-CM | POA: Diagnosis not present

## 2018-10-01 ENCOUNTER — Other Ambulatory Visit: Payer: Self-pay | Admitting: Internal Medicine

## 2018-10-01 DIAGNOSIS — Z1231 Encounter for screening mammogram for malignant neoplasm of breast: Secondary | ICD-10-CM

## 2018-10-07 ENCOUNTER — Other Ambulatory Visit: Payer: Self-pay | Admitting: Internal Medicine

## 2018-10-07 DIAGNOSIS — M8580 Other specified disorders of bone density and structure, unspecified site: Secondary | ICD-10-CM

## 2018-11-09 ENCOUNTER — Ambulatory Visit: Payer: 59

## 2018-12-11 ENCOUNTER — Ambulatory Visit
Admission: RE | Admit: 2018-12-11 | Discharge: 2018-12-11 | Disposition: A | Discharge status: Home / Self Care | Payer: Medicare Other | Source: Ambulatory Visit | Attending: Internal Medicine | Admitting: Internal Medicine

## 2018-12-11 DIAGNOSIS — M8580 Other specified disorders of bone density and structure, unspecified site: Secondary | ICD-10-CM

## 2018-12-11 DIAGNOSIS — Z1231 Encounter for screening mammogram for malignant neoplasm of breast: Secondary | ICD-10-CM

## 2019-01-12 ENCOUNTER — Other Ambulatory Visit: Payer: Self-pay | Admitting: Internal Medicine

## 2019-01-12 ENCOUNTER — Ambulatory Visit
Admission: RE | Admit: 2019-01-12 | Discharge: 2019-01-12 | Disposition: A | Discharge status: Home / Self Care | Payer: PPO | Source: Ambulatory Visit | Attending: Internal Medicine | Admitting: Internal Medicine

## 2019-01-12 DIAGNOSIS — R059 Cough, unspecified: Secondary | ICD-10-CM

## 2019-01-12 DIAGNOSIS — R05 Cough: Secondary | ICD-10-CM

## 2019-02-02 ENCOUNTER — Other Ambulatory Visit: Payer: Self-pay | Admitting: Internal Medicine

## 2019-02-02 ENCOUNTER — Ambulatory Visit
Admission: RE | Admit: 2019-02-02 | Discharge: 2019-02-02 | Disposition: A | Discharge status: Home / Self Care | Payer: PPO | Source: Ambulatory Visit | Attending: Internal Medicine | Admitting: Internal Medicine

## 2019-02-02 DIAGNOSIS — M152 Bouchard's nodes (with arthropathy): Secondary | ICD-10-CM | POA: Diagnosis not present

## 2019-02-02 DIAGNOSIS — J189 Pneumonia, unspecified organism: Secondary | ICD-10-CM

## 2019-02-02 DIAGNOSIS — I10 Essential (primary) hypertension: Secondary | ICD-10-CM | POA: Diagnosis not present

## 2019-02-02 DIAGNOSIS — R05 Cough: Secondary | ICD-10-CM | POA: Diagnosis not present

## 2019-02-08 DIAGNOSIS — J690 Pneumonitis due to inhalation of food and vomit: Secondary | ICD-10-CM | POA: Diagnosis not present

## 2019-04-08 DIAGNOSIS — M152 Bouchard's nodes (with arthropathy): Secondary | ICD-10-CM | POA: Diagnosis not present

## 2019-04-08 DIAGNOSIS — I10 Essential (primary) hypertension: Secondary | ICD-10-CM | POA: Diagnosis not present

## 2019-04-08 DIAGNOSIS — R636 Underweight: Secondary | ICD-10-CM | POA: Diagnosis not present

## 2019-04-08 DIAGNOSIS — M810 Age-related osteoporosis without current pathological fracture: Secondary | ICD-10-CM | POA: Diagnosis not present

## 2019-04-08 DIAGNOSIS — M25561 Pain in right knee: Secondary | ICD-10-CM | POA: Diagnosis not present

## 2019-07-27 DIAGNOSIS — D1801 Hemangioma of skin and subcutaneous tissue: Secondary | ICD-10-CM | POA: Diagnosis not present

## 2019-07-27 DIAGNOSIS — D225 Melanocytic nevi of trunk: Secondary | ICD-10-CM | POA: Diagnosis not present

## 2019-07-27 DIAGNOSIS — L400 Psoriasis vulgaris: Secondary | ICD-10-CM | POA: Diagnosis not present

## 2019-07-27 DIAGNOSIS — Z85828 Personal history of other malignant neoplasm of skin: Secondary | ICD-10-CM | POA: Diagnosis not present

## 2019-07-27 DIAGNOSIS — L814 Other melanin hyperpigmentation: Secondary | ICD-10-CM | POA: Diagnosis not present

## 2019-07-27 DIAGNOSIS — L821 Other seborrheic keratosis: Secondary | ICD-10-CM | POA: Diagnosis not present

## 2019-07-27 DIAGNOSIS — D485 Neoplasm of uncertain behavior of skin: Secondary | ICD-10-CM | POA: Diagnosis not present

## 2019-09-28 IMAGING — CR DG CHEST 2V
2 series · 2 of 2 positions shown · non-contrast
Comparison: 04/10/2010

CLINICAL DATA: Coughing congestion.

EXAM:
CHEST  2 VIEW

[w chest pa]
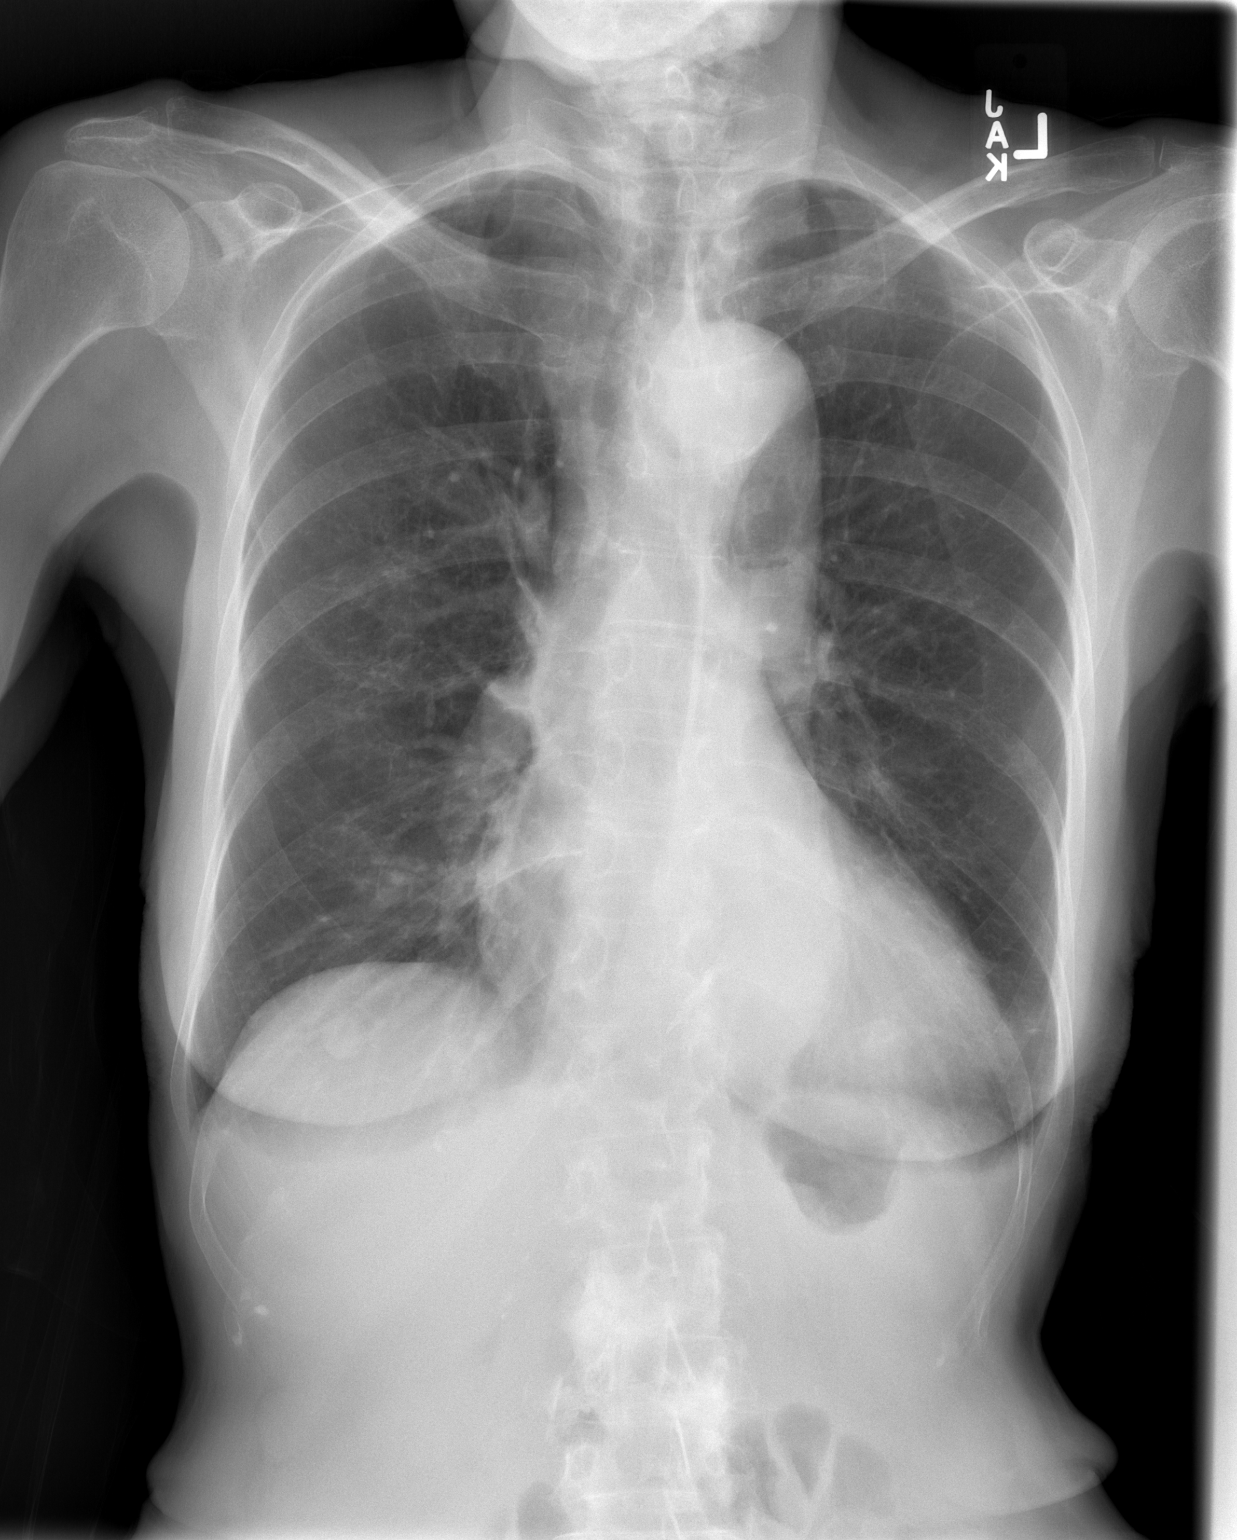

[w chest lat]
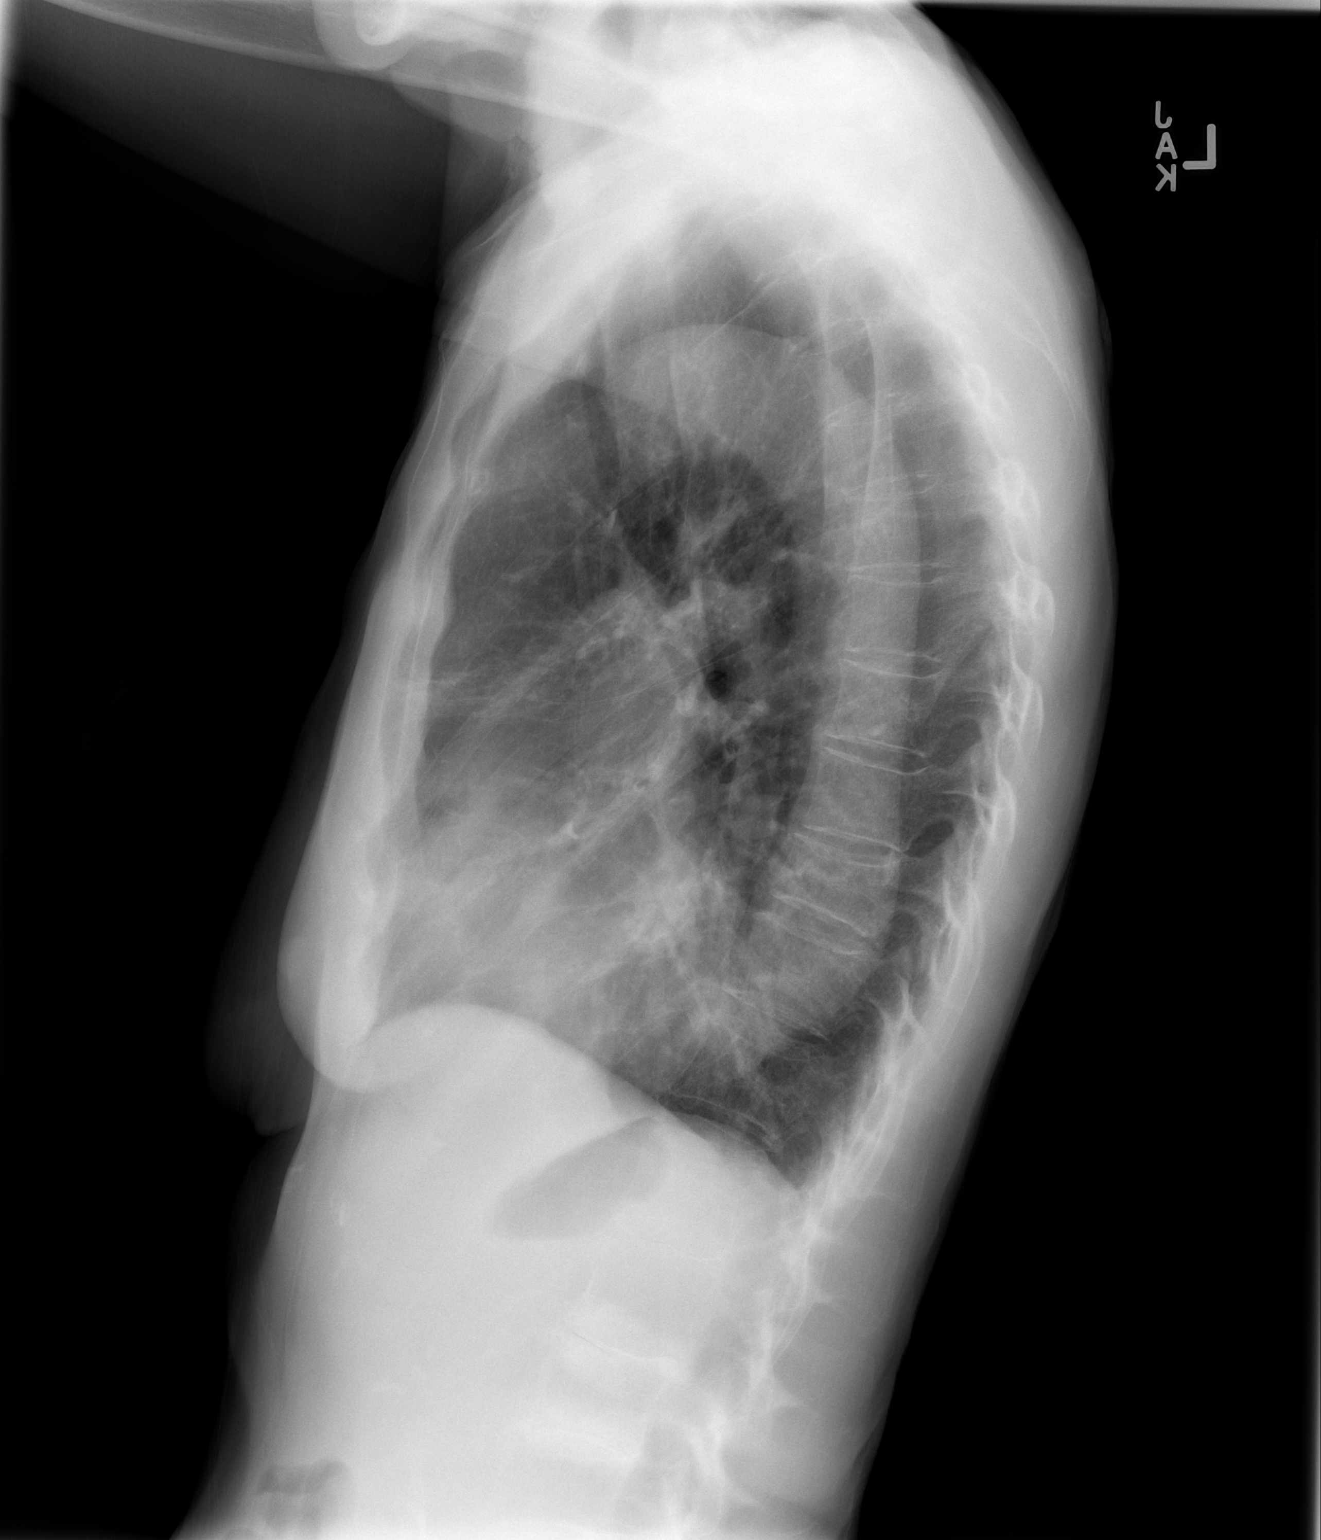

[2 of 2 positions shown; findings below may reference images not displayed]

FINDINGS: Lungs are hyperexpanded scratches that. Subtle micro nodularity in
the right parahilar region may be related to bronchopneumonia or
atypical infection. Left lung clear. The cardio pericardial
silhouette is enlarged. The visualized bony structures of the thorax
are intact.
IMPRESSION: Right parahilar airspace opacity compatible with bronchopneumonia or
atypical infection. Follow-up imaging recommended to ensure
resolution.

## 2019-09-29 DIAGNOSIS — Z23 Encounter for immunization: Secondary | ICD-10-CM | POA: Diagnosis not present

## 2019-10-08 DIAGNOSIS — M810 Age-related osteoporosis without current pathological fracture: Secondary | ICD-10-CM | POA: Diagnosis not present

## 2019-10-08 DIAGNOSIS — Z0000 Encounter for general adult medical examination without abnormal findings: Secondary | ICD-10-CM | POA: Diagnosis not present

## 2019-10-08 DIAGNOSIS — I10 Essential (primary) hypertension: Secondary | ICD-10-CM | POA: Diagnosis not present

## 2019-10-08 DIAGNOSIS — Z1389 Encounter for screening for other disorder: Secondary | ICD-10-CM | POA: Diagnosis not present

## 2019-10-08 DIAGNOSIS — R636 Underweight: Secondary | ICD-10-CM | POA: Diagnosis not present

## 2020-02-24 ENCOUNTER — Other Ambulatory Visit: Payer: Self-pay | Admitting: Internal Medicine

## 2020-02-24 DIAGNOSIS — Z1231 Encounter for screening mammogram for malignant neoplasm of breast: Secondary | ICD-10-CM

## 2020-03-28 ENCOUNTER — Other Ambulatory Visit: Payer: Self-pay

## 2020-03-28 ENCOUNTER — Ambulatory Visit
Admission: RE | Admit: 2020-03-28 | Discharge: 2020-03-28 | Disposition: A | Discharge status: Home / Self Care | Payer: PPO | Source: Ambulatory Visit | Attending: Internal Medicine | Admitting: Internal Medicine

## 2020-03-28 DIAGNOSIS — Z1231 Encounter for screening mammogram for malignant neoplasm of breast: Secondary | ICD-10-CM

## 2020-04-20 DIAGNOSIS — H26493 Other secondary cataract, bilateral: Secondary | ICD-10-CM | POA: Diagnosis not present

## 2020-04-20 DIAGNOSIS — H5202 Hypermetropia, left eye: Secondary | ICD-10-CM | POA: Diagnosis not present

## 2020-04-20 DIAGNOSIS — Z961 Presence of intraocular lens: Secondary | ICD-10-CM | POA: Diagnosis not present

## 2020-04-28 DIAGNOSIS — I10 Essential (primary) hypertension: Secondary | ICD-10-CM | POA: Diagnosis not present

## 2020-04-28 DIAGNOSIS — R636 Underweight: Secondary | ICD-10-CM | POA: Diagnosis not present

## 2020-05-17 DIAGNOSIS — D1801 Hemangioma of skin and subcutaneous tissue: Secondary | ICD-10-CM | POA: Diagnosis not present

## 2020-05-17 DIAGNOSIS — Z85828 Personal history of other malignant neoplasm of skin: Secondary | ICD-10-CM | POA: Diagnosis not present

## 2020-05-17 DIAGNOSIS — L309 Dermatitis, unspecified: Secondary | ICD-10-CM | POA: Diagnosis not present

## 2020-05-17 DIAGNOSIS — L814 Other melanin hyperpigmentation: Secondary | ICD-10-CM | POA: Diagnosis not present

## 2020-08-25 DIAGNOSIS — Z85828 Personal history of other malignant neoplasm of skin: Secondary | ICD-10-CM | POA: Diagnosis not present

## 2020-08-25 DIAGNOSIS — I8391 Asymptomatic varicose veins of right lower extremity: Secondary | ICD-10-CM | POA: Diagnosis not present

## 2020-08-25 DIAGNOSIS — L218 Other seborrheic dermatitis: Secondary | ICD-10-CM | POA: Diagnosis not present

## 2020-08-25 DIAGNOSIS — L821 Other seborrheic keratosis: Secondary | ICD-10-CM | POA: Diagnosis not present

## 2020-08-25 DIAGNOSIS — D225 Melanocytic nevi of trunk: Secondary | ICD-10-CM | POA: Diagnosis not present

## 2020-08-25 DIAGNOSIS — L814 Other melanin hyperpigmentation: Secondary | ICD-10-CM | POA: Diagnosis not present

## 2020-08-25 DIAGNOSIS — D1801 Hemangioma of skin and subcutaneous tissue: Secondary | ICD-10-CM | POA: Diagnosis not present

## 2020-09-13 DIAGNOSIS — M810 Age-related osteoporosis without current pathological fracture: Secondary | ICD-10-CM | POA: Diagnosis not present

## 2020-09-13 DIAGNOSIS — I10 Essential (primary) hypertension: Secondary | ICD-10-CM | POA: Diagnosis not present

## 2020-09-13 DIAGNOSIS — M152 Bouchard's nodes (with arthropathy): Secondary | ICD-10-CM | POA: Diagnosis not present

## 2020-09-13 DIAGNOSIS — M159 Polyosteoarthritis, unspecified: Secondary | ICD-10-CM | POA: Diagnosis not present

## 2020-10-07 IMAGING — DX DG CHEST 2V
2 series · 2 of 2 positions shown · non-contrast
Comparison: 01/12/2019, 02/27/2018

CLINICAL DATA: Dry cough

EXAM:
CHEST - 2 VIEW

[dg chest 2 view (1 of 2)]
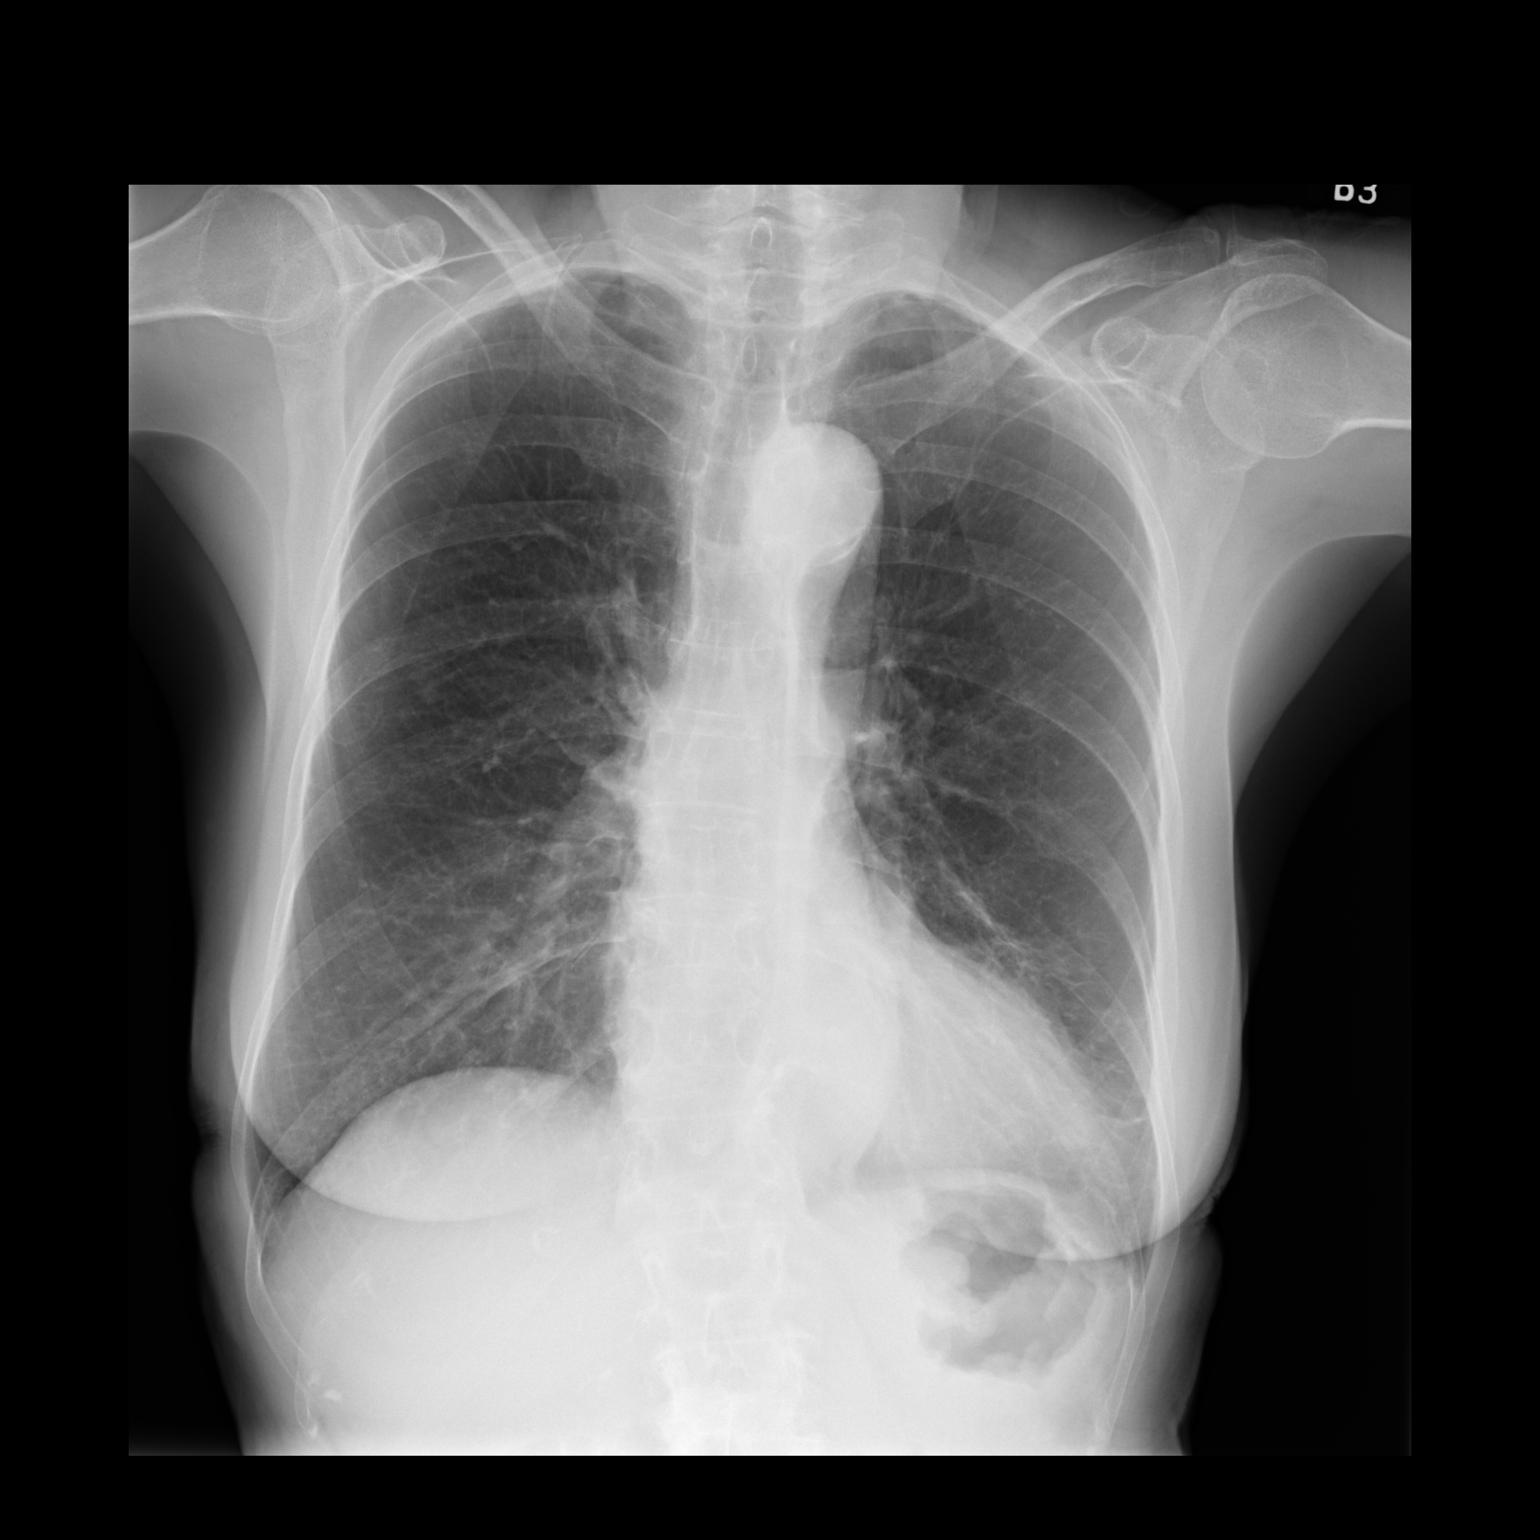

[dg chest 2 view (2 of 2)]
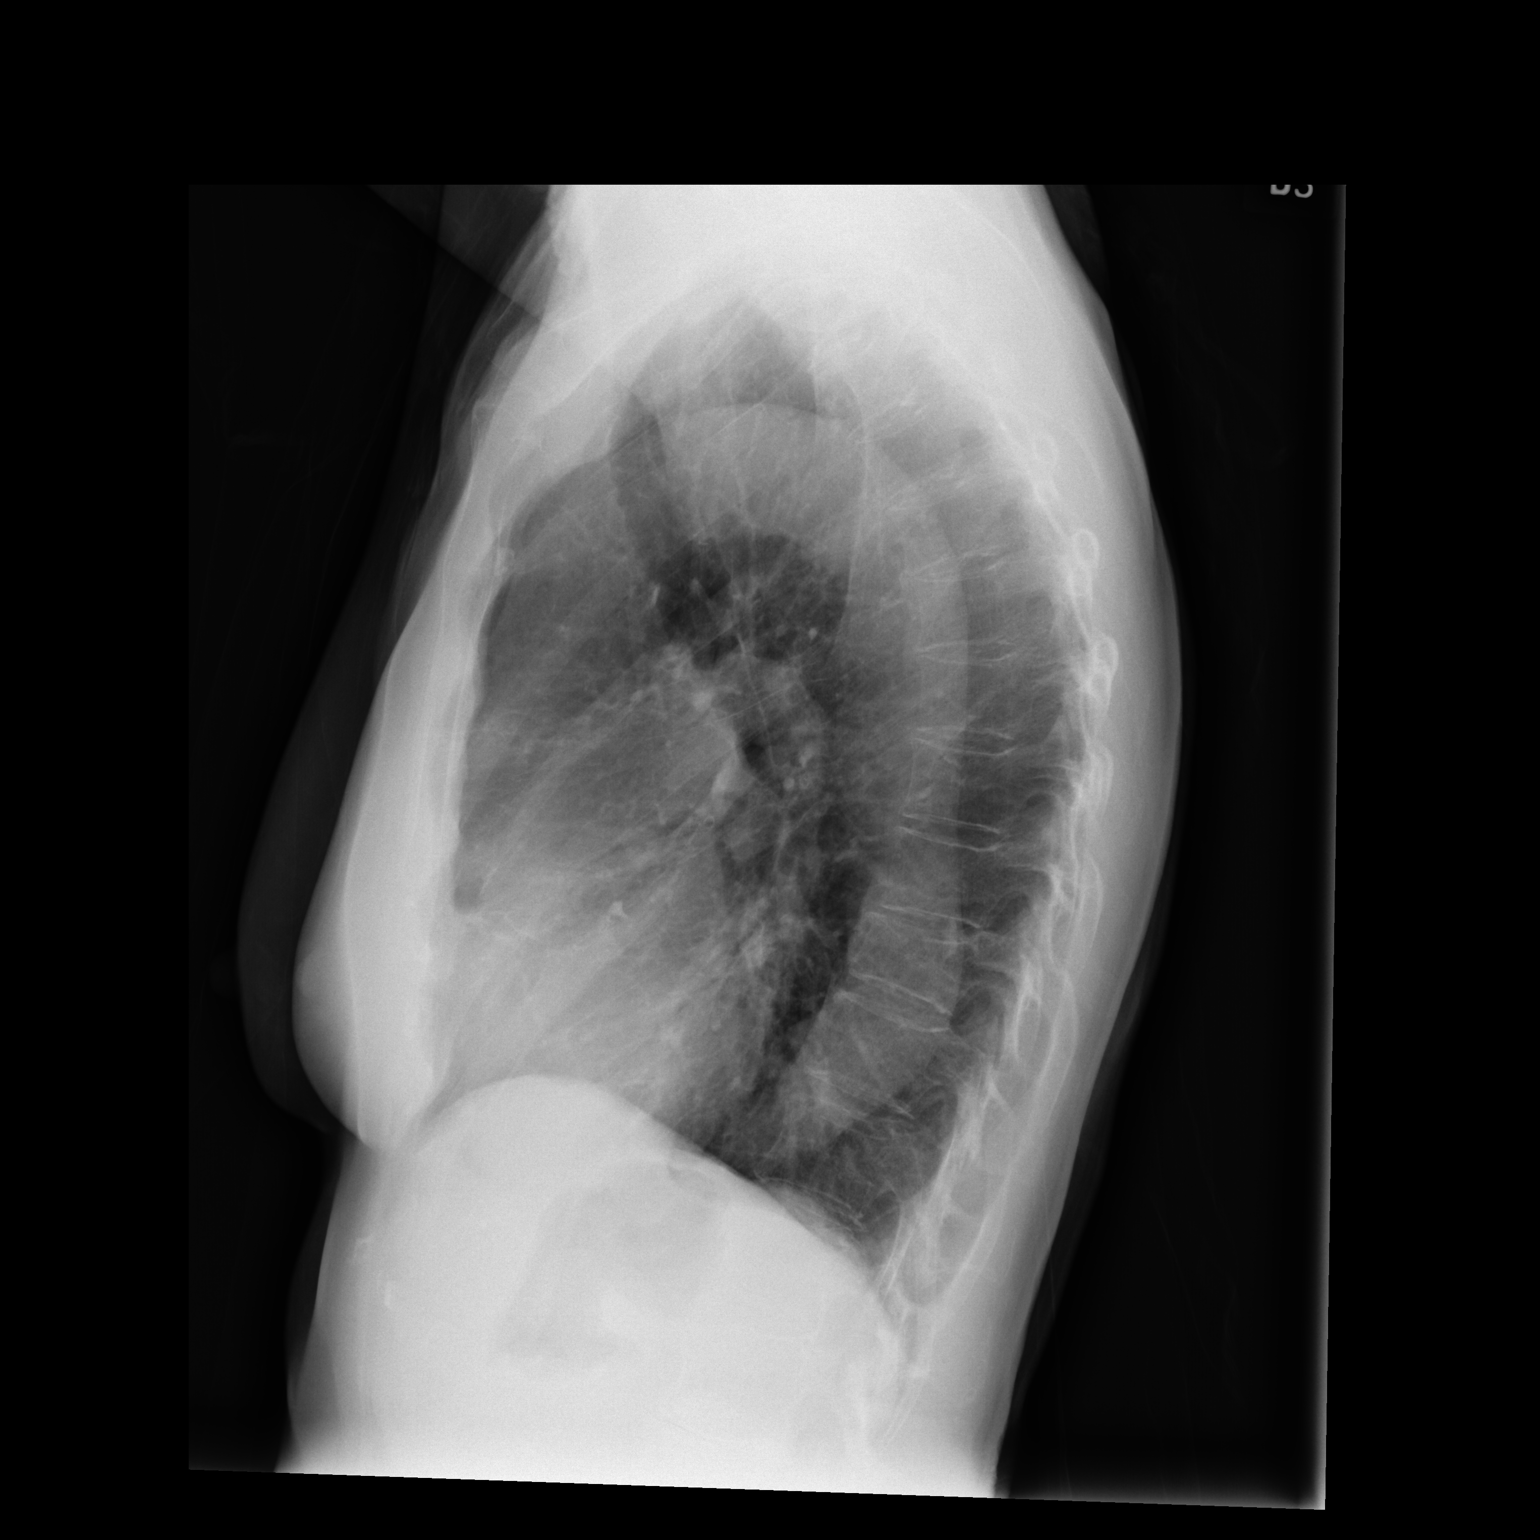

[2 of 2 positions shown; findings below may reference images not displayed]

FINDINGS: Cardiac shadow is stable. Aortic calcifications are again seen. The
lungs are hyperinflated consistent with COPD. Previously seen upper
lobe changes have resolved in the interval. Linear
scarring/atelectasis is again noted in the lingula stable from the
previous exam. Bony structures again demonstrate a mid to lower
thoracic spine compression deformity stable from the previous exam
as well as previous exams in 5076.
IMPRESSION: Resolution of previously seen upper lobe infiltrates.

Stable scarring/atelectasis in the left base.

## 2020-10-12 DIAGNOSIS — Z23 Encounter for immunization: Secondary | ICD-10-CM | POA: Diagnosis not present

## 2020-10-26 DIAGNOSIS — R636 Underweight: Secondary | ICD-10-CM | POA: Diagnosis not present

## 2020-10-26 DIAGNOSIS — Z1389 Encounter for screening for other disorder: Secondary | ICD-10-CM | POA: Diagnosis not present

## 2020-10-26 DIAGNOSIS — Z0000 Encounter for general adult medical examination without abnormal findings: Secondary | ICD-10-CM | POA: Diagnosis not present

## 2020-10-26 DIAGNOSIS — I10 Essential (primary) hypertension: Secondary | ICD-10-CM | POA: Diagnosis not present

## 2020-10-26 DIAGNOSIS — Z7189 Other specified counseling: Secondary | ICD-10-CM | POA: Diagnosis not present

## 2020-10-26 DIAGNOSIS — F5101 Primary insomnia: Secondary | ICD-10-CM | POA: Diagnosis not present

## 2020-10-26 DIAGNOSIS — Z136 Encounter for screening for cardiovascular disorders: Secondary | ICD-10-CM | POA: Diagnosis not present

## 2020-10-26 DIAGNOSIS — M810 Age-related osteoporosis without current pathological fracture: Secondary | ICD-10-CM | POA: Diagnosis not present

## 2020-10-31 DIAGNOSIS — M79671 Pain in right foot: Secondary | ICD-10-CM | POA: Diagnosis not present

## 2020-10-31 DIAGNOSIS — M79672 Pain in left foot: Secondary | ICD-10-CM | POA: Diagnosis not present

## 2020-10-31 DIAGNOSIS — L84 Corns and callosities: Secondary | ICD-10-CM | POA: Diagnosis not present

## 2020-10-31 DIAGNOSIS — L600 Ingrowing nail: Secondary | ICD-10-CM | POA: Diagnosis not present

## 2020-10-31 DIAGNOSIS — L602 Onychogryphosis: Secondary | ICD-10-CM | POA: Diagnosis not present

## 2021-02-23 DIAGNOSIS — L218 Other seborrheic dermatitis: Secondary | ICD-10-CM | POA: Diagnosis not present

## 2021-02-23 DIAGNOSIS — L738 Other specified follicular disorders: Secondary | ICD-10-CM | POA: Diagnosis not present

## 2021-02-23 DIAGNOSIS — Z85828 Personal history of other malignant neoplasm of skin: Secondary | ICD-10-CM | POA: Diagnosis not present

## 2021-02-23 DIAGNOSIS — L570 Actinic keratosis: Secondary | ICD-10-CM | POA: Diagnosis not present

## 2021-02-23 DIAGNOSIS — L720 Epidermal cyst: Secondary | ICD-10-CM | POA: Diagnosis not present

## 2021-02-23 DIAGNOSIS — L821 Other seborrheic keratosis: Secondary | ICD-10-CM | POA: Diagnosis not present

## 2021-02-23 DIAGNOSIS — D1801 Hemangioma of skin and subcutaneous tissue: Secondary | ICD-10-CM | POA: Diagnosis not present

## 2021-05-03 DIAGNOSIS — Z961 Presence of intraocular lens: Secondary | ICD-10-CM | POA: Diagnosis not present

## 2021-05-03 DIAGNOSIS — H26493 Other secondary cataract, bilateral: Secondary | ICD-10-CM | POA: Diagnosis not present

## 2021-05-03 DIAGNOSIS — H43813 Vitreous degeneration, bilateral: Secondary | ICD-10-CM | POA: Diagnosis not present

## 2021-05-09 DIAGNOSIS — M159 Polyosteoarthritis, unspecified: Secondary | ICD-10-CM | POA: Diagnosis not present

## 2021-05-09 DIAGNOSIS — M810 Age-related osteoporosis without current pathological fracture: Secondary | ICD-10-CM | POA: Diagnosis not present

## 2021-05-09 DIAGNOSIS — I10 Essential (primary) hypertension: Secondary | ICD-10-CM | POA: Diagnosis not present

## 2021-05-09 DIAGNOSIS — M152 Bouchard's nodes (with arthropathy): Secondary | ICD-10-CM | POA: Diagnosis not present

## 2021-05-21 DIAGNOSIS — L821 Other seborrheic keratosis: Secondary | ICD-10-CM | POA: Diagnosis not present

## 2021-05-21 DIAGNOSIS — L400 Psoriasis vulgaris: Secondary | ICD-10-CM | POA: Diagnosis not present

## 2021-05-21 DIAGNOSIS — L570 Actinic keratosis: Secondary | ICD-10-CM | POA: Diagnosis not present

## 2021-05-21 DIAGNOSIS — D1801 Hemangioma of skin and subcutaneous tissue: Secondary | ICD-10-CM | POA: Diagnosis not present

## 2021-05-21 DIAGNOSIS — Z85828 Personal history of other malignant neoplasm of skin: Secondary | ICD-10-CM | POA: Diagnosis not present

## 2021-06-07 ENCOUNTER — Other Ambulatory Visit: Payer: Self-pay | Admitting: Internal Medicine

## 2021-06-07 DIAGNOSIS — Z1231 Encounter for screening mammogram for malignant neoplasm of breast: Secondary | ICD-10-CM

## 2021-07-23 DIAGNOSIS — M810 Age-related osteoporosis without current pathological fracture: Secondary | ICD-10-CM | POA: Diagnosis not present

## 2021-07-23 DIAGNOSIS — M152 Bouchard's nodes (with arthropathy): Secondary | ICD-10-CM | POA: Diagnosis not present

## 2021-07-23 DIAGNOSIS — I10 Essential (primary) hypertension: Secondary | ICD-10-CM | POA: Diagnosis not present

## 2021-07-23 DIAGNOSIS — M159 Polyosteoarthritis, unspecified: Secondary | ICD-10-CM | POA: Diagnosis not present

## 2021-08-02 ENCOUNTER — Other Ambulatory Visit: Payer: Self-pay

## 2021-08-02 ENCOUNTER — Ambulatory Visit
Admission: RE | Admit: 2021-08-02 | Discharge: 2021-08-02 | Disposition: A | Discharge status: Home / Self Care | Payer: PPO | Source: Ambulatory Visit | Attending: Internal Medicine | Admitting: Internal Medicine

## 2021-08-02 DIAGNOSIS — Z1231 Encounter for screening mammogram for malignant neoplasm of breast: Secondary | ICD-10-CM | POA: Diagnosis not present

## 2021-09-16 DIAGNOSIS — I10 Essential (primary) hypertension: Secondary | ICD-10-CM | POA: Diagnosis not present

## 2021-09-16 DIAGNOSIS — M810 Age-related osteoporosis without current pathological fracture: Secondary | ICD-10-CM | POA: Diagnosis not present

## 2021-09-16 DIAGNOSIS — M159 Polyosteoarthritis, unspecified: Secondary | ICD-10-CM | POA: Diagnosis not present

## 2021-09-18 DIAGNOSIS — Z85828 Personal history of other malignant neoplasm of skin: Secondary | ICD-10-CM | POA: Diagnosis not present

## 2021-09-18 DIAGNOSIS — L821 Other seborrheic keratosis: Secondary | ICD-10-CM | POA: Diagnosis not present

## 2021-09-18 DIAGNOSIS — D1801 Hemangioma of skin and subcutaneous tissue: Secondary | ICD-10-CM | POA: Diagnosis not present

## 2021-10-30 DIAGNOSIS — F5101 Primary insomnia: Secondary | ICD-10-CM | POA: Diagnosis not present

## 2021-10-30 DIAGNOSIS — Z1389 Encounter for screening for other disorder: Secondary | ICD-10-CM | POA: Diagnosis not present

## 2021-10-30 DIAGNOSIS — I10 Essential (primary) hypertension: Secondary | ICD-10-CM | POA: Diagnosis not present

## 2021-10-30 DIAGNOSIS — M810 Age-related osteoporosis without current pathological fracture: Secondary | ICD-10-CM | POA: Diagnosis not present

## 2021-10-30 DIAGNOSIS — Z0000 Encounter for general adult medical examination without abnormal findings: Secondary | ICD-10-CM | POA: Diagnosis not present

## 2021-11-17 ENCOUNTER — Encounter (HOSPITAL_BASED_OUTPATIENT_CLINIC_OR_DEPARTMENT_OTHER): Payer: Self-pay | Admitting: *Deleted

## 2021-11-17 ENCOUNTER — Emergency Department (HOSPITAL_BASED_OUTPATIENT_CLINIC_OR_DEPARTMENT_OTHER)
Admission: EM | Admit: 2021-11-17 | Discharge: 2021-11-17 | Disposition: A | Discharge status: Home / Self Care | Payer: PPO | Attending: Emergency Medicine | Admitting: Emergency Medicine

## 2021-11-17 ENCOUNTER — Other Ambulatory Visit: Payer: Self-pay

## 2021-11-17 DIAGNOSIS — Z20822 Contact with and (suspected) exposure to covid-19: Secondary | ICD-10-CM | POA: Insufficient documentation

## 2021-11-17 DIAGNOSIS — I10 Essential (primary) hypertension: Secondary | ICD-10-CM | POA: Insufficient documentation

## 2021-11-17 DIAGNOSIS — Z79899 Other long term (current) drug therapy: Secondary | ICD-10-CM | POA: Diagnosis not present

## 2021-11-17 DIAGNOSIS — R059 Cough, unspecified: Secondary | ICD-10-CM | POA: Diagnosis present

## 2021-11-17 DIAGNOSIS — J069 Acute upper respiratory infection, unspecified: Secondary | ICD-10-CM | POA: Diagnosis not present

## 2021-11-17 HISTORY — DX: Essential (primary) hypertension: I10

## 2021-11-17 LAB — RESP PANEL BY RT-PCR (FLU A&B, COVID) ARPGX2
Influenza A by PCR: NEGATIVE
Influenza B by PCR: NEGATIVE
SARS Coronavirus 2 by RT PCR: NEGATIVE

## 2021-11-17 MED ORDER — FLUTICASONE PROPIONATE 50 MCG/ACT NA SUSP: 2.0000 | Freq: Every day | NASAL | 0 refills | Status: AC

## 2021-11-17 MED ORDER — BENZONATATE 100 MG PO CAPS: 100.0000 mg | ORAL_CAPSULE | Freq: Three times a day (TID) | ORAL | 0 refills | Status: AC

## 2021-11-17 NOTE — ED Triage Notes (Signed)
Complaint of cough and runny nose started yesterday. Patient wants to be tested for Covid prior for her departure on Tuesday to [place].

## 2021-11-17 NOTE — Discharge Instructions (Signed)

## 2021-11-17 NOTE — ED Provider Notes (Signed)
[facility] EMERGENCY DEPT Provider Note   CSN: [id number] Arrival date & time: 11/17/21  1032     History Chief Complaint  Patient presents with   Cough   Nasal Congestion    Hannah Sims is a 85 y.o. female.  HPI  85 year old female with history of hypertension presents emergency department today requesting COVID testing.  Patient states she did not realize she was coming to the emergency department.  She simply wanted a COVID test.  She has had congestion and a mild cough for the last day.  She denies any fevers, chest pain, shortness of breath or other systemic symptoms.  She does wants to know if she is safe to spend time with her family over the holiday.  Past Medical History:  Diagnosis Date   Hypertension     Patient Active Problem List   Diagnosis Date Noted   Osteoarthritis of left knee 06/25/2016   Arch strain 07/23/2011    Past Surgical History:  Procedure Laterality Date   ABDOMINAL HYSTERECTOMY       OB History     Gravida  2   Para  2   Term      Preterm      AB      Living         SAB      IAB      Ectopic      Multiple      Live Births              Family History  Problem Relation Age of Onset   Breast cancer Neg Hx     Social History   Tobacco Use   Smoking status: Never   Smokeless tobacco: Never  Vaping Use   Vaping Use: Never used  Substance Use Topics   Alcohol use: Yes    Comment: occasionally   Drug use: Never    Home Medications Prior to Admission medications   Medication Sig Start Date End Date Taking? Authorizing Provider  benzonatate (TESSALON) 100 MG capsule Take 1 capsule (100 mg total) by mouth every 8 (eight) hours for 5 days. 11/17/21 11/22/21 Yes Idamay Hosein S, PA-C  fluticasone (FLONASE) 50 MCG/ACT nasal spray Place 2 sprays into both nostrils daily. 11/17/21  Yes Rubye Strohmeyer S, PA-C  calcium carbonate 200 MG capsule Take 250 mg by mouth daily.      [provider]  fluorometholone (FML) 0.1 % ophthalmic suspension  04/25/16   [provider]  lisinopril-hydrochlorothiazide (PRINZIDE,ZESTORETIC) 10-12.5 MG per tablet Take 10-12.5 mg by mouth daily. 07/12/11   [provider]  Multiple Vitamin (MULTIVITAMIN) tablet Take 1 tablet by mouth daily.      [provider]  VITAMIN D, CHOLECALCIFEROL, PO Take 1 tablet by mouth daily.      [provider]  VIVELLE-DOT 0.025 MG/24HR Place 0.025 mg onto the skin 2 (two) times a week. 07/10/11   [provider]  zolpidem (AMBIEN) 5 MG tablet  06/07/16   [provider]    Allergies    Patient has no known allergies.  Review of Systems   Review of Systems  Constitutional:  Negative for fever.  HENT:  Positive for congestion.   Eyes:  Negative for visual disturbance.  Respiratory:  Positive for cough. Negative for shortness of breath.   Cardiovascular:  Negative for chest pain.  Gastrointestinal:  Negative for abdominal pain, constipation, diarrhea, nausea and vomiting.  Genitourinary:  Negative for  pelvic pain.  Musculoskeletal:  Negative for myalgias.  Neurological:  Negative for headaches.   Physical Exam Updated Vital Signs BP (!) 153/102 (BP Location: Right Arm)   Pulse 84   Temp 98.6 F (37 C)   Resp 16   Ht 5' 3.5" (1.613 m)   Wt 49 kg   SpO2 97%   BMI 18.83 kg/m   Physical Exam Vitals and nursing note reviewed.  Constitutional:      General: She is not in acute distress.    Appearance: She is well-developed.  HENT:     Head: Normocephalic and atraumatic.  Eyes:     Conjunctiva/sclera: Conjunctivae normal.  Cardiovascular:     Rate and Rhythm: Normal rate and regular rhythm.     Heart sounds: Normal heart sounds.  Pulmonary:     Effort: Pulmonary effort is normal.     Breath sounds: Normal breath sounds. No wheezing, rhonchi or rales.  Abdominal:     Palpations: Abdomen is soft.     Tenderness: There is no abdominal  tenderness.  Musculoskeletal:        General: Normal range of motion.     Cervical back: Neck supple.  Skin:    General: Skin is warm and dry.  Neurological:     Mental Status: She is alert.    ED Results / Procedures / Treatments   Labs (all labs ordered are listed, but only abnormal results are displayed) Labs Reviewed  RESP PANEL BY RT-PCR (FLU A&B, COVID) [id number]    EKG None  Radiology No results found.  Procedures Procedures   Medications Ordered in ED Medications - No data to display  ED Course  I have reviewed the triage vital signs and the nursing notes.  Pertinent labs & imaging results that were available during my care of the patient were reviewed by me and considered in my medical decision making (see chart for details).    MDM Rules/Calculators/A&P                          Pt here for covid test. Congestion and cough x1 day. Sxs mild. She does not want further testing besides a covid test. Covid/flu test neg. Patients symptoms are consistent with URI, likely viral etiology. Discussed that antibiotics are not indicated for viral infections. Pt will be discharged with symptomatic treatment.  Verbalizes understanding and is agreeable with plan. Pt is hemodynamically stable & in NAD prior to dc.    Final Clinical Impression(s) / ED Diagnoses Final diagnoses:  Upper respiratory tract infection, unspecified type    Rx / DC Orders ED Discharge Orders          Ordered    benzonatate (TESSALON) 100 MG capsule  Every 8 hours        11/17/21 1230    fluticasone (FLONASE) 50 MCG/ACT nasal spray  Daily        11/17/21 [address], Tatia Petrucci S, PA-C 11/17/21 1232    Sherwood Gambler, MD 11/17/21 1419

## 2021-11-17 NOTE — ED Notes (Signed)
Pt d/c home per MD order. Discharge summary reviewed with pt, pt verbalizes understanding. Ambulatory off unit. No s/s of acute distress noted at discharge.,  °

## 2021-12-06 ENCOUNTER — Other Ambulatory Visit: Payer: Self-pay | Admitting: Internal Medicine

## 2021-12-06 ENCOUNTER — Ambulatory Visit
Admission: RE | Admit: 2021-12-06 | Discharge: 2021-12-06 | Disposition: A | Discharge status: Home / Self Care | Payer: PPO | Source: Ambulatory Visit | Attending: Internal Medicine | Admitting: Internal Medicine

## 2021-12-06 DIAGNOSIS — J189 Pneumonia, unspecified organism: Secondary | ICD-10-CM | POA: Diagnosis not present

## 2021-12-06 DIAGNOSIS — R0989 Other specified symptoms and signs involving the circulatory and respiratory systems: Secondary | ICD-10-CM

## 2021-12-06 DIAGNOSIS — Z20828 Contact with and (suspected) exposure to other viral communicable diseases: Secondary | ICD-10-CM | POA: Diagnosis not present

## 2021-12-06 DIAGNOSIS — I700 Atherosclerosis of aorta: Secondary | ICD-10-CM | POA: Diagnosis not present

## 2021-12-06 DIAGNOSIS — R5383 Other fatigue: Secondary | ICD-10-CM | POA: Diagnosis not present

## 2021-12-06 DIAGNOSIS — E871 Hypo-osmolality and hyponatremia: Secondary | ICD-10-CM | POA: Diagnosis not present

## 2021-12-06 DIAGNOSIS — R918 Other nonspecific abnormal finding of lung field: Secondary | ICD-10-CM | POA: Diagnosis not present

## 2021-12-07 DIAGNOSIS — E871 Hypo-osmolality and hyponatremia: Secondary | ICD-10-CM | POA: Diagnosis not present

## 2021-12-10 DIAGNOSIS — E871 Hypo-osmolality and hyponatremia: Secondary | ICD-10-CM | POA: Diagnosis not present

## 2021-12-11 DIAGNOSIS — M810 Age-related osteoporosis without current pathological fracture: Secondary | ICD-10-CM | POA: Diagnosis not present

## 2021-12-11 DIAGNOSIS — I10 Essential (primary) hypertension: Secondary | ICD-10-CM | POA: Diagnosis not present

## 2021-12-11 DIAGNOSIS — G4700 Insomnia, unspecified: Secondary | ICD-10-CM | POA: Diagnosis not present

## 2021-12-13 DIAGNOSIS — D649 Anemia, unspecified: Secondary | ICD-10-CM | POA: Diagnosis not present

## 2021-12-13 DIAGNOSIS — E871 Hypo-osmolality and hyponatremia: Secondary | ICD-10-CM | POA: Diagnosis not present

## 2021-12-13 DIAGNOSIS — G4700 Insomnia, unspecified: Secondary | ICD-10-CM | POA: Diagnosis not present

## 2021-12-13 DIAGNOSIS — R5383 Other fatigue: Secondary | ICD-10-CM | POA: Diagnosis not present

## 2021-12-13 DIAGNOSIS — I10 Essential (primary) hypertension: Secondary | ICD-10-CM | POA: Diagnosis not present

## 2021-12-25 DIAGNOSIS — M79672 Pain in left foot: Secondary | ICD-10-CM | POA: Diagnosis not present

## 2021-12-25 DIAGNOSIS — M79671 Pain in right foot: Secondary | ICD-10-CM | POA: Diagnosis not present

## 2021-12-25 DIAGNOSIS — L602 Onychogryphosis: Secondary | ICD-10-CM | POA: Diagnosis not present

## 2021-12-25 DIAGNOSIS — L84 Corns and callosities: Secondary | ICD-10-CM | POA: Diagnosis not present

## 2022-04-01 DIAGNOSIS — H26493 Other secondary cataract, bilateral: Secondary | ICD-10-CM | POA: Diagnosis not present

## 2022-04-01 DIAGNOSIS — H43813 Vitreous degeneration, bilateral: Secondary | ICD-10-CM | POA: Diagnosis not present

## 2022-04-01 DIAGNOSIS — H5202 Hypermetropia, left eye: Secondary | ICD-10-CM | POA: Diagnosis not present

## 2022-04-16 DIAGNOSIS — L602 Onychogryphosis: Secondary | ICD-10-CM | POA: Diagnosis not present

## 2022-04-16 DIAGNOSIS — L84 Corns and callosities: Secondary | ICD-10-CM | POA: Diagnosis not present

## 2022-04-16 DIAGNOSIS — M79671 Pain in right foot: Secondary | ICD-10-CM | POA: Diagnosis not present

## 2022-04-16 DIAGNOSIS — M79672 Pain in left foot: Secondary | ICD-10-CM | POA: Diagnosis not present

## 2022-05-16 DIAGNOSIS — M810 Age-related osteoporosis without current pathological fracture: Secondary | ICD-10-CM | POA: Diagnosis not present

## 2022-05-16 DIAGNOSIS — I700 Atherosclerosis of aorta: Secondary | ICD-10-CM | POA: Diagnosis not present

## 2022-05-16 DIAGNOSIS — F5101 Primary insomnia: Secondary | ICD-10-CM | POA: Diagnosis not present

## 2022-05-16 DIAGNOSIS — I10 Essential (primary) hypertension: Secondary | ICD-10-CM | POA: Diagnosis not present

## 2022-05-16 DIAGNOSIS — R636 Underweight: Secondary | ICD-10-CM | POA: Diagnosis not present

## 2022-05-16 DIAGNOSIS — J069 Acute upper respiratory infection, unspecified: Secondary | ICD-10-CM | POA: Diagnosis not present

## 2022-05-20 ENCOUNTER — Other Ambulatory Visit: Payer: Self-pay | Admitting: Internal Medicine

## 2022-05-20 DIAGNOSIS — M810 Age-related osteoporosis without current pathological fracture: Secondary | ICD-10-CM

## 2022-07-23 DIAGNOSIS — M79671 Pain in right foot: Secondary | ICD-10-CM | POA: Diagnosis not present

## 2022-07-23 DIAGNOSIS — L84 Corns and callosities: Secondary | ICD-10-CM | POA: Diagnosis not present

## 2022-07-23 DIAGNOSIS — L602 Onychogryphosis: Secondary | ICD-10-CM | POA: Diagnosis not present

## 2022-07-23 DIAGNOSIS — M79672 Pain in left foot: Secondary | ICD-10-CM | POA: Diagnosis not present

## 2022-08-05 ENCOUNTER — Other Ambulatory Visit: Payer: Self-pay | Admitting: Internal Medicine

## 2022-08-05 DIAGNOSIS — Z1231 Encounter for screening mammogram for malignant neoplasm of breast: Secondary | ICD-10-CM

## 2022-08-13 ENCOUNTER — Ambulatory Visit
Admission: RE | Admit: 2022-08-13 | Discharge: 2022-08-13 | Disposition: A | Discharge status: Home / Self Care | Payer: PPO | Source: Ambulatory Visit | Attending: Internal Medicine | Admitting: Internal Medicine

## 2022-08-13 DIAGNOSIS — Z780 Asymptomatic menopausal state: Secondary | ICD-10-CM | POA: Diagnosis not present

## 2022-08-13 DIAGNOSIS — M810 Age-related osteoporosis without current pathological fracture: Secondary | ICD-10-CM | POA: Diagnosis not present

## 2022-08-13 DIAGNOSIS — M85832 Other specified disorders of bone density and structure, left forearm: Secondary | ICD-10-CM | POA: Diagnosis not present

## 2022-08-16 ENCOUNTER — Ambulatory Visit: Payer: PPO

## 2022-08-30 ENCOUNTER — Ambulatory Visit: Payer: PPO

## 2022-08-30 ENCOUNTER — Ambulatory Visit
Admission: RE | Admit: 2022-08-30 | Discharge: 2022-08-30 | Disposition: A | Discharge status: Home / Self Care | Payer: PPO | Source: Ambulatory Visit | Attending: Internal Medicine | Admitting: Internal Medicine

## 2022-08-30 DIAGNOSIS — Z1231 Encounter for screening mammogram for malignant neoplasm of breast: Secondary | ICD-10-CM | POA: Diagnosis not present

## 2022-09-19 DIAGNOSIS — Z85828 Personal history of other malignant neoplasm of skin: Secondary | ICD-10-CM | POA: Diagnosis not present

## 2022-09-19 DIAGNOSIS — D1801 Hemangioma of skin and subcutaneous tissue: Secondary | ICD-10-CM | POA: Diagnosis not present

## 2022-09-19 DIAGNOSIS — L650 Telogen effluvium: Secondary | ICD-10-CM | POA: Diagnosis not present

## 2022-09-19 DIAGNOSIS — D485 Neoplasm of uncertain behavior of skin: Secondary | ICD-10-CM | POA: Diagnosis not present

## 2022-09-19 DIAGNOSIS — L821 Other seborrheic keratosis: Secondary | ICD-10-CM | POA: Diagnosis not present

## 2022-09-19 DIAGNOSIS — Z79899 Other long term (current) drug therapy: Secondary | ICD-10-CM | POA: Diagnosis not present

## 2022-10-12 DIAGNOSIS — Z23 Encounter for immunization: Secondary | ICD-10-CM | POA: Diagnosis not present

## 2022-11-05 DIAGNOSIS — L602 Onychogryphosis: Secondary | ICD-10-CM | POA: Diagnosis not present

## 2022-11-05 DIAGNOSIS — M79671 Pain in right foot: Secondary | ICD-10-CM | POA: Diagnosis not present

## 2022-11-05 DIAGNOSIS — L84 Corns and callosities: Secondary | ICD-10-CM | POA: Diagnosis not present

## 2022-11-05 DIAGNOSIS — M79672 Pain in left foot: Secondary | ICD-10-CM | POA: Diagnosis not present

## 2022-11-07 DIAGNOSIS — M810 Age-related osteoporosis without current pathological fracture: Secondary | ICD-10-CM | POA: Diagnosis not present

## 2022-11-07 DIAGNOSIS — Z0000 Encounter for general adult medical examination without abnormal findings: Secondary | ICD-10-CM | POA: Diagnosis not present

## 2022-11-07 DIAGNOSIS — Z5181 Encounter for therapeutic drug level monitoring: Secondary | ICD-10-CM | POA: Diagnosis not present

## 2022-11-07 DIAGNOSIS — D649 Anemia, unspecified: Secondary | ICD-10-CM | POA: Diagnosis not present

## 2022-11-07 DIAGNOSIS — L659 Nonscarring hair loss, unspecified: Secondary | ICD-10-CM | POA: Diagnosis not present

## 2022-11-07 DIAGNOSIS — E871 Hypo-osmolality and hyponatremia: Secondary | ICD-10-CM | POA: Diagnosis not present

## 2022-11-07 DIAGNOSIS — Z1331 Encounter for screening for depression: Secondary | ICD-10-CM | POA: Diagnosis not present

## 2022-11-07 DIAGNOSIS — I10 Essential (primary) hypertension: Secondary | ICD-10-CM | POA: Diagnosis not present

## 2022-11-07 DIAGNOSIS — F5101 Primary insomnia: Secondary | ICD-10-CM | POA: Diagnosis not present

## 2022-11-08 ENCOUNTER — Other Ambulatory Visit: Payer: PPO

## 2022-11-11 ENCOUNTER — Other Ambulatory Visit (HOSPITAL_BASED_OUTPATIENT_CLINIC_OR_DEPARTMENT_OTHER): Payer: Self-pay

## 2022-11-11 MED ORDER — AREXVY 120 MCG/0.5ML IM SUSR
INTRAMUSCULAR | 0 refills | Status: AC
  Filled 2022-11-11: qty 0.5, 1d supply, fill #0

## 2022-12-02 DIAGNOSIS — E871 Hypo-osmolality and hyponatremia: Secondary | ICD-10-CM | POA: Diagnosis not present

## 2022-12-02 DIAGNOSIS — R051 Acute cough: Secondary | ICD-10-CM | POA: Diagnosis not present

## 2022-12-03 ENCOUNTER — Ambulatory Visit
Admission: RE | Admit: 2022-12-03 | Discharge: 2022-12-03 | Disposition: A | Discharge status: Home / Self Care | Payer: PPO | Source: Ambulatory Visit | Attending: Internal Medicine | Admitting: Internal Medicine

## 2022-12-03 ENCOUNTER — Other Ambulatory Visit: Payer: Self-pay | Admitting: Internal Medicine

## 2022-12-03 DIAGNOSIS — R059 Cough, unspecified: Secondary | ICD-10-CM | POA: Diagnosis not present

## 2022-12-03 DIAGNOSIS — R051 Acute cough: Secondary | ICD-10-CM

## 2022-12-03 DIAGNOSIS — Z03818 Encounter for observation for suspected exposure to other biological agents ruled out: Secondary | ICD-10-CM | POA: Diagnosis not present

## 2022-12-11 DIAGNOSIS — R5383 Other fatigue: Secondary | ICD-10-CM | POA: Diagnosis not present

## 2022-12-11 DIAGNOSIS — R051 Acute cough: Secondary | ICD-10-CM | POA: Diagnosis not present

## 2022-12-11 DIAGNOSIS — E871 Hypo-osmolality and hyponatremia: Secondary | ICD-10-CM | POA: Diagnosis not present

## 2022-12-11 DIAGNOSIS — I10 Essential (primary) hypertension: Secondary | ICD-10-CM | POA: Diagnosis not present

## 2022-12-18 DIAGNOSIS — R531 Weakness: Secondary | ICD-10-CM | POA: Diagnosis not present

## 2022-12-18 DIAGNOSIS — R2681 Unsteadiness on feet: Secondary | ICD-10-CM | POA: Diagnosis not present

## 2022-12-18 DIAGNOSIS — R051 Acute cough: Secondary | ICD-10-CM | POA: Diagnosis not present

## 2022-12-18 DIAGNOSIS — G4700 Insomnia, unspecified: Secondary | ICD-10-CM | POA: Diagnosis not present

## 2022-12-18 DIAGNOSIS — E871 Hypo-osmolality and hyponatremia: Secondary | ICD-10-CM | POA: Diagnosis not present

## 2022-12-24 ENCOUNTER — Other Ambulatory Visit: Payer: Self-pay

## 2022-12-24 ENCOUNTER — Emergency Department (HOSPITAL_BASED_OUTPATIENT_CLINIC_OR_DEPARTMENT_OTHER): Payer: PPO | Admitting: Radiology

## 2022-12-24 ENCOUNTER — Emergency Department (HOSPITAL_BASED_OUTPATIENT_CLINIC_OR_DEPARTMENT_OTHER)
Admission: EM | Admit: 2022-12-24 | Discharge: 2022-12-24 | Disposition: A | Discharge status: Home / Self Care | Payer: PPO | Attending: Emergency Medicine | Admitting: Emergency Medicine

## 2022-12-24 ENCOUNTER — Encounter (HOSPITAL_BASED_OUTPATIENT_CLINIC_OR_DEPARTMENT_OTHER): Payer: Self-pay

## 2022-12-24 DIAGNOSIS — U071 COVID-19: Secondary | ICD-10-CM | POA: Insufficient documentation

## 2022-12-24 DIAGNOSIS — R059 Cough, unspecified: Secondary | ICD-10-CM | POA: Diagnosis present

## 2022-12-24 DIAGNOSIS — R0602 Shortness of breath: Secondary | ICD-10-CM | POA: Diagnosis not present

## 2022-12-24 DIAGNOSIS — R051 Acute cough: Secondary | ICD-10-CM

## 2022-12-24 LAB — URINALYSIS, ROUTINE W REFLEX MICROSCOPIC
Bacteria, UA: NONE SEEN
Bilirubin Urine: NEGATIVE
Glucose, UA: NEGATIVE mg/dL
Hgb urine dipstick: NEGATIVE
Ketones, ur: NEGATIVE mg/dL
Leukocytes,Ua: NEGATIVE
Nitrite: NEGATIVE
Protein, ur: 30 mg/dL — AB
Specific Gravity, Urine: 1.019 (ref 1.005–1.030)
pH: 7 (ref 5.0–8.0)

## 2022-12-24 LAB — CBC
HCT: 35.5 % — ABNORMAL LOW (ref 36.0–46.0)
Hemoglobin: 12.4 g/dL (ref 12.0–15.0)
MCH: 33.2 pg (ref 26.0–34.0)
MCHC: 34.9 g/dL (ref 30.0–36.0)
MCV: 95.2 fL (ref 80.0–100.0)
Platelets: 259 10*3/uL (ref 150–400)
RBC: 3.73 MIL/uL — ABNORMAL LOW (ref 3.87–5.11)
RDW: 13.5 % (ref 11.5–15.5)
WBC: 6.2 10*3/uL (ref 4.0–10.5)
nRBC: 0 % (ref 0.0–0.2)

## 2022-12-24 LAB — COMPREHENSIVE METABOLIC PANEL
ALT: 41 U/L (ref 0–44)
AST: 86 U/L — ABNORMAL HIGH (ref 15–41)
Albumin: 4.5 g/dL (ref 3.5–5.0)
Alkaline Phosphatase: 55 U/L (ref 38–126)
Anion gap: 12 (ref 5–15)
BUN: 20 mg/dL (ref 8–23)
CO2: 25 mmol/L (ref 22–32)
Calcium: 9.3 mg/dL (ref 8.9–10.3)
Chloride: 100 mmol/L (ref 98–111)
Creatinine, Ser: 0.58 mg/dL (ref 0.44–1.00)
GFR, Estimated: >60 mL/min (ref >60)
Glucose, Bld: 131 mg/dL — ABNORMAL HIGH (ref 70–99)
Potassium: 3.3 mmol/L — ABNORMAL LOW (ref 3.5–5.1)
Sodium: 137 mmol/L (ref 135–145)
Total Bilirubin: 0.6 mg/dL (ref 0.3–1.2)
Total Protein: 7.3 g/dL (ref 6.5–8.1)

## 2022-12-24 LAB — RESP PANEL BY RT-PCR (RSV, FLU A&B, COVID)  RVPGX2
Influenza A by PCR: NEGATIVE
Influenza B by PCR: NEGATIVE
Resp Syncytial Virus by PCR: NEGATIVE
SARS Coronavirus 2 by RT PCR: POSITIVE — AB

## 2022-12-24 MED ORDER — BENZONATATE 100 MG PO CAPS: 100.0000 mg | ORAL_CAPSULE | Freq: Three times a day (TID) | ORAL | 0 refills | Status: AC

## 2022-12-24 MED ORDER — NIRMATRELVIR/RITONAVIR (PAXLOVID)TABLET: 3.0000 | ORAL_TABLET | Freq: Two times a day (BID) | ORAL | 0 refills | Status: AC

## 2022-12-24 NOTE — ED Triage Notes (Signed)
Patient here POV from Home.  Endorses Fatigue since Thanksgiving. Initially felt better but then Symptoms returned. Had Antibiotic Therapy that began in early December due to Congestion, Cough and other URI Symptoms.   Breath Sounds improved and Patient had Lab Studies completed 1-2 Weeks ago due to continued Fatigue and generalized Weakness and no remarkable abnormalities were found.   Per [facility] Staff the Patient had Ambulatory Hypoxia at 89% while Baseline SPO2 at Rest was 94-96%.   NAD Noted during Triage. A&Ox4. GCS 15. BIB Wheelchair.

## 2022-12-24 NOTE — Discharge Instructions (Addendum)
You were seen in the emergency department today and diagnosed with COVID-19.  I sent in a prescription for Paxlovid as well as Tessalon to help alleviate symptoms.  The patient begins to experience any increasing confusion, difficulty eating or drinking compared to baseline, or significant trouble breathing, please have her return to the emergency department for further evaluation and treatment.  Please also ensure that her nursing staff at her independent living are aware of her diagnosis and are more readily available to help care for her if needed.

## 2022-12-24 NOTE — ED Provider Notes (Signed)
[place] EMERGENCY DEPT Provider Note   CSN: [id number] Arrival date & time: 12/24/22  1439     History Chief Complaint  Patient presents with   Fatigue    Hannah Sims is a 86 y.o. female.  HPI Patient presented to the emergency department with complaints of 1 month of fatigue.  Based on recorded history from daughter and son they are present in the room, it appears that she initially had some kind of viral infection that occurred around Thanksgiving time which she appeared to have recovered about a week after initial onset of symptoms but then began to experience decline in symptoms has been persistent up until this point today.  Patient has not had any prior COVID-19 testing.  She reports that she feels otherwise fine has had a slight cough, but has had profound fatigue for the past month.  She denies any chest pain, shortness of breath, abdominal pain, headache, nausea, vomiting, diarrhea.    Home Medications Prior to Admission medications   Medication Sig Start Date End Date Taking? Authorizing Provider  benzonatate (TESSALON) 100 MG capsule Take 1 capsule (100 mg total) by mouth every 8 (eight) hours. 12/24/22  Yes Luvenia Heller, PA-C  nirmatrelvir/ritonavir (PAXLOVID) 20 x 150 MG & 10 x '100MG'$  TABS Take 3 tablets by mouth 2 (two) times daily for 5 days. Patient GFR is >60. Take nirmatrelvir (150 mg) two tablets twice daily for 5 days and ritonavir (100 mg) one tablet twice daily for 5 days. 12/24/22 12/29/22 Yes Lourdes Sledge A, PA-C  calcium carbonate 200 MG capsule Take 250 mg by mouth daily.      [provider]  fluorometholone (FML) 0.1 % ophthalmic suspension  04/25/16   [provider]  fluticasone (FLONASE) 50 MCG/ACT nasal spray Place 2 sprays into both nostrils daily. 11/17/21   Couture, Cortni S, PA-C  lisinopril-hydrochlorothiazide (PRINZIDE,ZESTORETIC) 10-12.5 MG per tablet Take 10-12.5 mg by mouth daily. 07/12/11   [provider]  Multiple Vitamin (MULTIVITAMIN) tablet Take 1 tablet by mouth daily.      [provider]  RSV vaccine recomb adjuvanted (AREXVY) 120 MCG/0.5ML injection Inject into the muscle. 11/11/22     VITAMIN D, CHOLECALCIFEROL, PO Take 1 tablet by mouth daily.      [provider]  VIVELLE-DOT 0.025 MG/24HR Place 0.025 mg onto the skin 2 (two) times a week. 07/10/11   [provider]  zolpidem (AMBIEN) 5 MG tablet  06/07/16   [provider]      Allergies    Patient has no known allergies.    Review of Systems   Review of Systems  Constitutional:  Negative for chills and fatigue.  Respiratory:  Negative for chest tightness.   Cardiovascular:  Negative for chest pain.  Gastrointestinal:  Negative for abdominal pain, constipation, nausea and vomiting.  Neurological:  Negative for dizziness, weakness, light-headedness and headaches.  All other systems reviewed and are negative.   Physical Exam Updated Vital Signs BP (!) 160/93   Pulse 81   Temp 98 F (36.7 C)   Resp (!) 21   Ht 5' 3.5" (1.613 m)   Wt 49 kg   SpO2 97%   BMI 18.84 kg/m  Physical Exam Vitals and nursing note reviewed.  Constitutional:      General: She is not in acute distress.    Appearance: Normal appearance. She is not ill-appearing.  HENT:     Head: Normocephalic and atraumatic.  Eyes:  Conjunctiva/sclera: Conjunctivae normal.  Cardiovascular:     Rate and Rhythm: Normal rate and regular rhythm.  Pulmonary:     Effort: Pulmonary effort is normal. No respiratory distress.     Breath sounds: Normal breath sounds. No wheezing or rales.  Abdominal:     General: Abdomen is flat. Bowel sounds are normal.  Skin:    Capillary Refill: Capillary refill takes less than 2 seconds.     Coloration: Skin is not jaundiced.     Findings: No bruising.  Neurological:     Mental Status: She is alert.     ED Results / Procedures / Treatments   Labs (all labs ordered are  listed, but only abnormal results are displayed) Labs Reviewed  RESP PANEL BY RT-PCR (RSV, FLU A&B, COVID)  [id number] - Abnormal; Notable for the following components:      Result Value   SARS Coronavirus 2 by RT PCR POSITIVE (*)    All other components within normal limits  COMPREHENSIVE METABOLIC PANEL - Abnormal; Notable for the following components:   Potassium 3.3 (*)    Glucose, Bld 131 (*)    AST 86 (*)    All other components within normal limits  CBC - Abnormal; Notable for the following components:   RBC 3.73 (*)    HCT 35.5 (*)    All other components within normal limits  URINALYSIS, ROUTINE W REFLEX MICROSCOPIC - Abnormal; Notable for the following components:   Protein, ur 30 (*)    All other components within normal limits    EKG EKG Interpretation  Date/Time:  Tuesday December 24 2022 15:05:22 EST Ventricular Rate:  86 PR Interval:  130 QRS Duration: 68 QT Interval:  302 QTC Calculation: 361 R Axis:   -27 Text Interpretation: Sinus rhythm with Premature supraventricular complexes Confirmed by Lennice Sites ([id number]) on 12/24/2022 3:10:05 PM  Radiology DG Chest 2 View  Result Date: 12/24/2022 CLINICAL DATA:  Shortness of breath EXAM: CHEST - 2 VIEW COMPARISON:  12/03/2022 FINDINGS: Stable cardiomediastinal contours. Aortic atherosclerosis. Hyperinflated lungs. No focal airspace consolidation, pleural effusion, or pneumothorax. IMPRESSION: No active cardiopulmonary disease. Electronically Signed   By: Davina Poke D.O.   On: 12/24/2022 15:41    Procedures Procedures   Medications Ordered in ED Medications - No data to display  ED Course/ Medical Decision Making/ A&P                           Medical Decision Making Risk Prescription drug management.   This patient presents to the ED for concern of fatigue.  Differential diagnosis includes COVID-19, anemia of chronic disease, anorexia, dementia   Lab Tests:  I Ordered, and personally interpreted  labs.  The pertinent results include: COVID-19 positive   Imaging Studies ordered:  I ordered imaging studies including chest x-ray I independently visualized and interpreted imaging which showed no evidence of cardiopulmonary disease I agree with the radiologist interpretation   Medicines ordered and prescription drug management:  I have reviewed the patients home medicines and have made adjustments as needed   Problem List / ED Course:  Patient presented to the emergency department concerns of generalized fatigue for the past month.  Per reported story from son and daughter, it appears patient initially became ill with some kind of viral infection around Thanksgiving time about a month ago and has struggled to recover fully since that time.  He reports that they believe that she may have  initially contracted some kind of virus or possibly COVID-19 around that time and has been struggling ever since then.  Patient does believe that she recently became more sick in the last few days with this increasing cough in the last day or 2.  Based on reassuring physical exam as well as all reassuring laboratory work and anemia appears to be typical for patient based on her history, I believe patient is safe to discharge home with a prescription for Paxlovid as well as Tessalon to help with her cough.  Patient and her family were agreeable to this treatment plan and discharge home to her independent living.  Patient and family verbalized understanding all return precautions.  No further questions at end of this assessment.   Final Clinical Impression(s) / ED Diagnoses Final diagnoses:  COVID-19  Acute cough    Rx / DC Orders ED Discharge Orders          Ordered    benzonatate (TESSALON) 100 MG capsule  Every 8 hours        12/24/22 2101    nirmatrelvir/ritonavir (PAXLOVID) 20 x 150 MG & 10 x '100MG'$  TABS  2 times daily        12/24/22 2101              Luvenia Heller, PA-C 12/24/22  2104    Lennice Sites, DO 12/24/22 2331

## 2023-01-03 DIAGNOSIS — R2681 Unsteadiness on feet: Secondary | ICD-10-CM | POA: Diagnosis not present

## 2023-01-03 DIAGNOSIS — R634 Abnormal weight loss: Secondary | ICD-10-CM | POA: Diagnosis not present

## 2023-01-03 DIAGNOSIS — G4700 Insomnia, unspecified: Secondary | ICD-10-CM | POA: Diagnosis not present

## 2023-01-03 DIAGNOSIS — R531 Weakness: Secondary | ICD-10-CM | POA: Diagnosis not present

## 2023-01-03 DIAGNOSIS — R739 Hyperglycemia, unspecified: Secondary | ICD-10-CM | POA: Diagnosis not present

## 2023-08-11 IMAGING — CR DG CHEST 2V
2 series · 2 of 2 positions shown · non-contrast
Comparison: Cxr 02/02/2019.

CLINICAL DATA: Abnormal lung sounds

EXAM:
CHEST - 2 VIEW

[w chest pa]
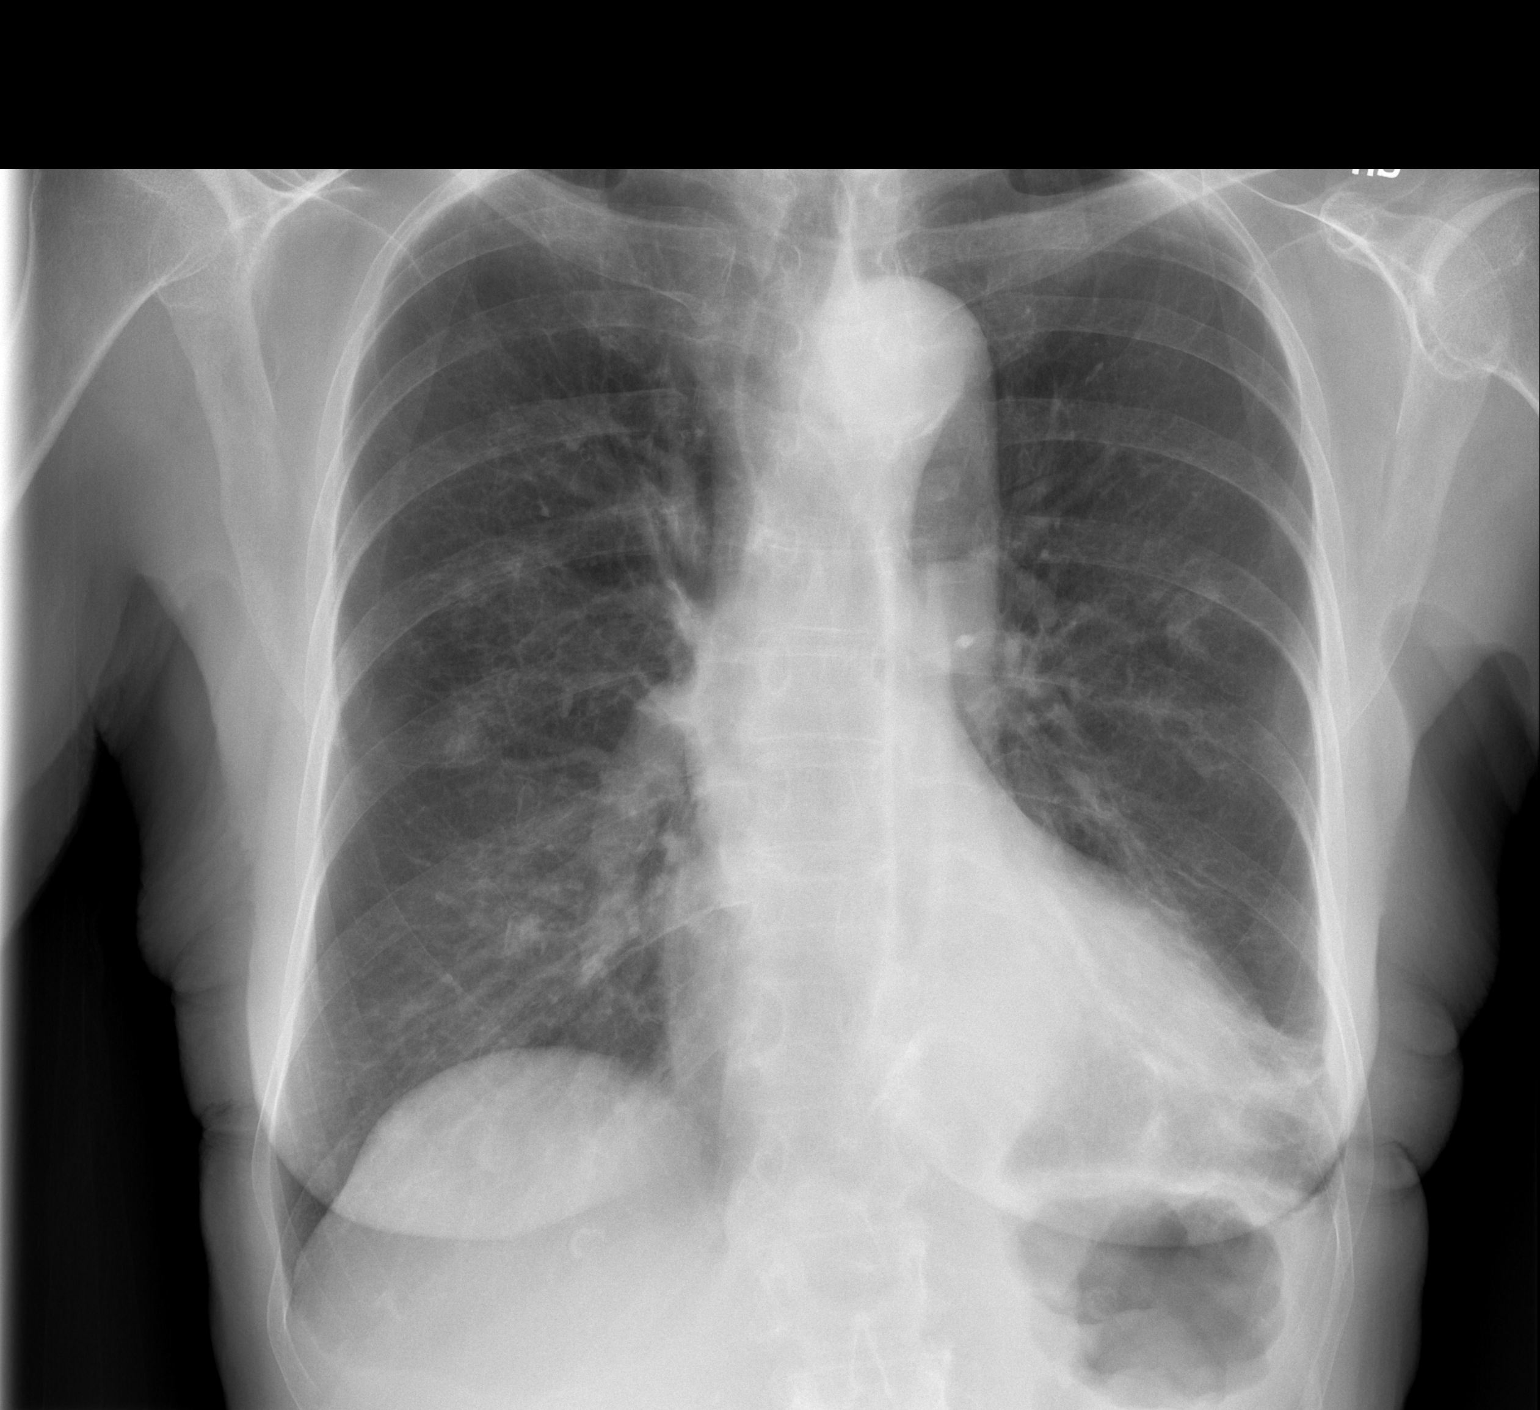

[w chest lat]
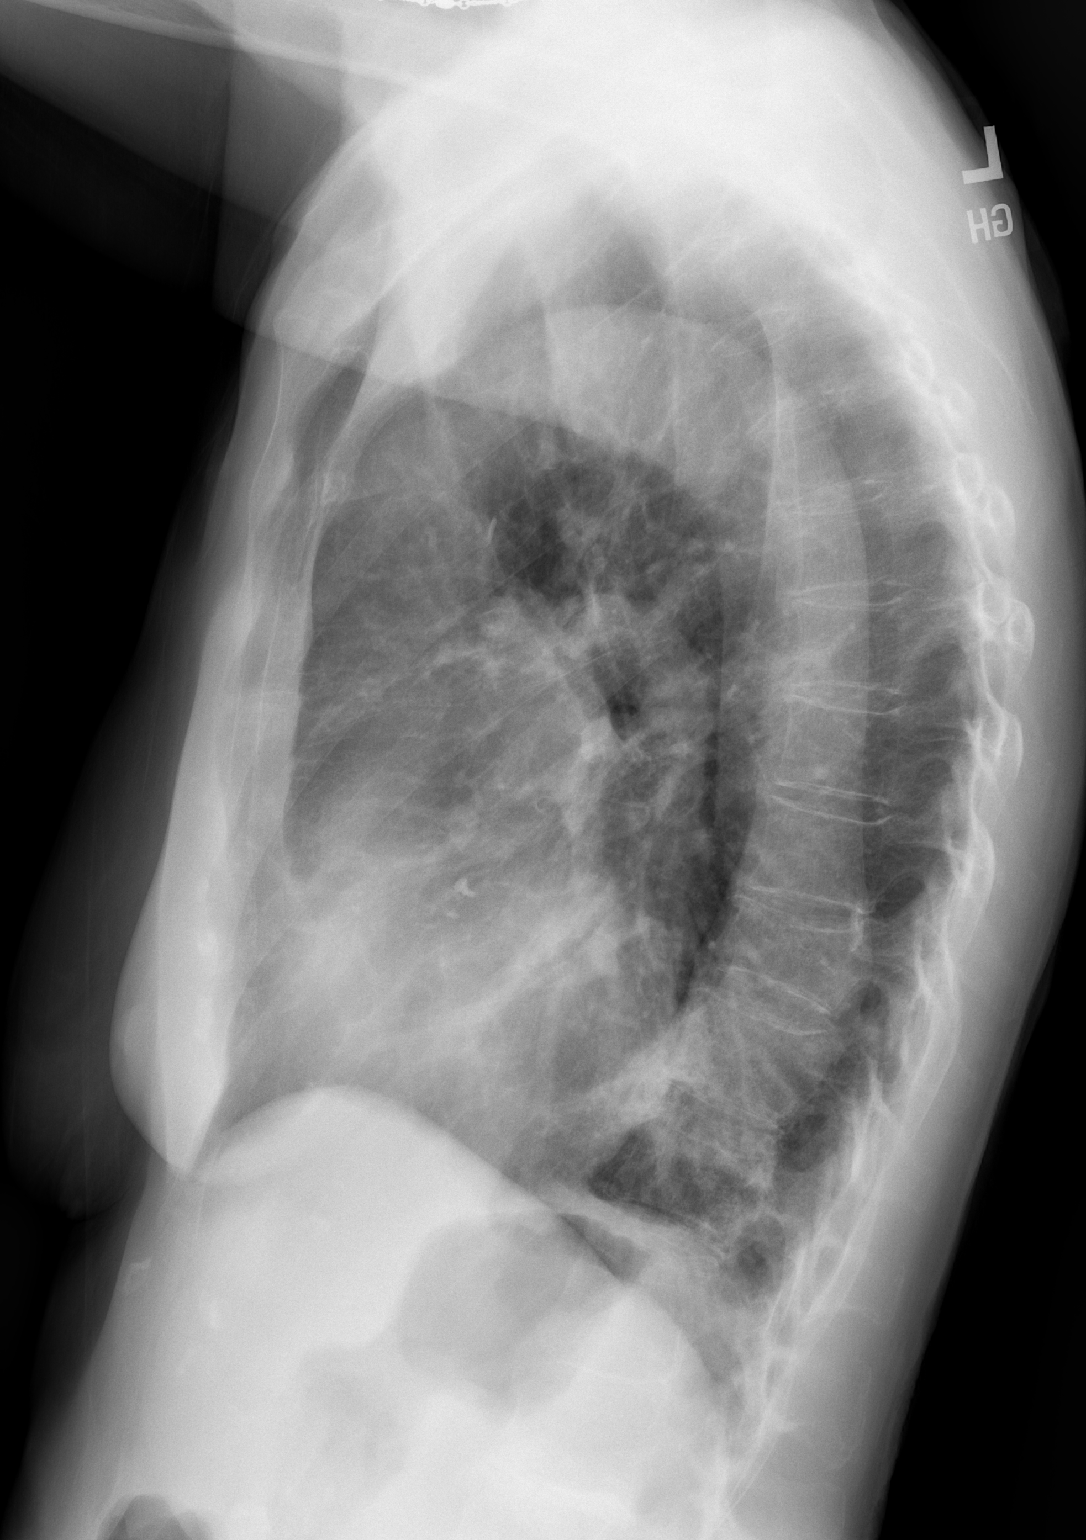

[2 of 2 positions shown; findings below may reference images not displayed]

FINDINGS: The heart and mediastinal contours are unchanged. Aortic
calcification.

Streaky airspace opacities at the left base likely atelectasis.
Similar-appearing vague pulmonary nodules overlying the right lung.
No focal consolidation. No pulmonary edema. No pleural effusion. No
pneumothorax.

No acute osseous abnormality.
IMPRESSION: 1. Streaky airspace opacities at the left base likely atelectasis.
Superimposed infection/inflammation not excluded.
2. Otherwise no active cardiopulmonary disease.
3.  Aortic Atherosclerosis ([id number]).

## 2023-10-09 ENCOUNTER — Other Ambulatory Visit (HOSPITAL_BASED_OUTPATIENT_CLINIC_OR_DEPARTMENT_OTHER): Payer: Self-pay

## 2023-10-09 MED ORDER — FLUAD 0.5 ML IM SUSY
PREFILLED_SYRINGE | INTRAMUSCULAR | 0 refills | Status: AC
  Filled 2023-10-09: qty 0.5, 1d supply, fill #0

## 2023-11-11 ENCOUNTER — Other Ambulatory Visit: Payer: Self-pay | Admitting: Internal Medicine

## 2023-11-11 DIAGNOSIS — Z1231 Encounter for screening mammogram for malignant neoplasm of breast: Secondary | ICD-10-CM

## 2024-07-18 ENCOUNTER — Emergency Department (HOSPITAL_BASED_OUTPATIENT_CLINIC_OR_DEPARTMENT_OTHER)
Admission: EM | Admit: 2024-07-18 | Discharge: 2024-07-18 | Disposition: A | Discharge status: Home / Self Care | Attending: Emergency Medicine | Admitting: Emergency Medicine

## 2024-07-18 ENCOUNTER — Other Ambulatory Visit: Payer: Self-pay

## 2024-07-18 DIAGNOSIS — S63641A Sprain of metacarpophalangeal joint of right thumb, initial encounter: Secondary | ICD-10-CM | POA: Diagnosis not present

## 2024-07-18 DIAGNOSIS — Z79899 Other long term (current) drug therapy: Secondary | ICD-10-CM | POA: Diagnosis not present

## 2024-07-18 DIAGNOSIS — M79644 Pain in right finger(s): Secondary | ICD-10-CM | POA: Diagnosis present

## 2024-07-18 DIAGNOSIS — X58XXXA Exposure to other specified factors, initial encounter: Secondary | ICD-10-CM | POA: Insufficient documentation

## 2024-07-18 DIAGNOSIS — I10 Essential (primary) hypertension: Secondary | ICD-10-CM | POA: Diagnosis not present

## 2024-07-18 MED ORDER — NAPROXEN 375 MG PO TABS: 375.0000 mg | ORAL_TABLET | Freq: Two times a day (BID) | ORAL | 0 refills | Status: AC

## 2024-07-18 MED ORDER — NAPROXEN 250 MG PO TABS
500.0000 mg | ORAL_TABLET | Freq: Once | ORAL | Status: AC
Start: 2024-07-18 — End: 2024-07-18
  Administered 2024-07-18: 500 mg via ORAL
  Filled 2024-07-18: qty 2

## 2024-07-18 NOTE — ED Notes (Signed)
 DC paperwork given and verbally understood.

## 2024-07-18 NOTE — ED Triage Notes (Signed)
 Patient states right hand pain near thumb. States noticed it when she woke up this morning. Denies injury.

## 2024-07-18 NOTE — ED Provider Notes (Signed)
 [place] EMERGENCY DEPARTMENT AT [facility] Provider Note   CSN: [id number] Arrival date & time: 07/18/24  9074     Patient presents with: Hand Pain   Hannah Sims is a 88 y.o. female.   88 year old female with a past medical history of high blood pressure presents to the ED with a chief complaint of right thumb pain.  Patient is right-hand dominant, reports she spent yesterday playing her guitar for the residents at [place], singing happy birthday.  She reports she has done this for years, she has never had her thumb hurt this much.  She reports taking Tylenol without any improvement in her symptoms.  Patient reports waking up with pain along the base of the right thumb exacerbated with any type of movement.  No prior history of gout, no fever, no trauma.  The history is provided by the patient.  Hand Pain       Prior to Admission medications   Medication Sig Start Date End Date Taking? Authorizing Provider  naproxen  (NAPROSYN ) 375 MG tablet Take 1 tablet (375 mg total) by mouth 2 (two) times daily for 5 days. 07/18/24 07/23/24 Yes Tilman Mcclaren, PA-C  benzonatate  (TESSALON ) 100 MG capsule Take 1 capsule (100 mg total) by mouth every 8 (eight) hours. 12/24/22   Zelaya, Oscar A, PA-C  calcium carbonate 200 MG capsule Take 250 mg by mouth daily.      [provider]  fluorometholone (FML) 0.1 % ophthalmic suspension  04/25/16   [provider]  fluticasone  (FLONASE ) 50 MCG/ACT nasal spray Place 2 sprays into both nostrils daily. 11/17/21   Couture, Cortni S, PA-C  influenza vaccine adjuvanted (FLUAD ) 0.5 ML injection Inject into the muscle. 10/09/23   Luiz Channel, MD  lisinopril-hydrochlorothiazide (PRINZIDE,ZESTORETIC) 10-12.5 MG per tablet Take 10-12.5 mg by mouth daily. 07/12/11   [provider]  Multiple Vitamin (MULTIVITAMIN) tablet Take 1 tablet by mouth daily.      [provider]  RSV vaccine recomb adjuvanted (AREXVY ) 120  MCG/0.5ML injection Inject into the muscle. 11/11/22     VITAMIN D, CHOLECALCIFEROL, PO Take 1 tablet by mouth daily.      [provider]  VIVELLE-DOT 0.025 MG/24HR Place 0.025 mg onto the skin 2 (two) times a week. 07/10/11   [provider]  zolpidem (AMBIEN) 5 MG tablet  06/07/16   [provider]    Allergies: Patient has no known allergies.    Review of Systems  Constitutional:  Negative for fever.  Musculoskeletal:  Positive for arthralgias.    Updated Vital Signs BP (!) 164/98   Pulse 80   Temp 98.3 F (36.8 C) (Oral)   Resp 16   SpO2 95%   Physical Exam Vitals and nursing note reviewed.  Constitutional:      Appearance: Normal appearance.  HENT:     Head: Normocephalic and atraumatic.     Mouth/Throat:     Mouth: Mucous membranes are moist.  Cardiovascular:     Rate and Rhythm: Normal rate.  Pulmonary:     Effort: Pulmonary effort is normal.  Abdominal:     General: Abdomen is flat.  Musculoskeletal:        General: Tenderness present.     Right wrist: Tenderness and snuff box tenderness present. No deformity or effusion. Normal range of motion. Normal pulse.     Cervical back: Normal range of motion and neck supple.     Comments: Tenderness along the snuffbox, there is worsening pain  with flexion and extension of the right thumb.  No swelling, no deformity noted.2+ radial pulse  Skin:    General: Skin is warm and dry.  Neurological:     Mental Status: She is alert and oriented to person, place, and time.     (all labs ordered are listed, but only abnormal results are displayed) Labs Reviewed - No data to display  EKG: None  Radiology: No results found.   Procedures   Medications Ordered in the ED  naproxen  (NAPROSYN ) tablet 500 mg (has no administration in time range)                                    Medical Decision Making Risk Prescription drug management.   Patient presented to the ED with a chief complaint  of right thumb pain after biting her guitar for multiple hours yesterday.  She is right-hand dominant.  Reports has been doing this for years, however states this is never happened in the past.  She reports waking up with pain, there was no trauma, no fever, no prior history of gout.  On evaluation she is neurovascularly intact, tenderness to palpation along the snuffbox, worsening pain with any thumb flexion over extension.  No bruising noted, no trauma.  Suspicion for right thumb strain after playing her guitar for several hours.  We discussed supportive therapy with splint, anti-inflammatories for home.  She is agreeable to plan and treatment, return precautions discussed at length.  Patient stable for discharge.  Portions of this note were generated with Scientist, clinical (histocompatibility and immunogenetics). Dictation errors may occur despite best attempts at proofreading.   Final diagnoses:  Sprain of metacarpophalangeal (MCP) joint of right thumb, initial encounter    ED Discharge Orders          Ordered    naproxen  (NAPROSYN ) 375 MG tablet  2 times daily        07/18/24 0957               Jeryl Umholtz, PA-C 07/18/24 1010    Patsey Lot, MD 07/18/24 1505

## 2024-07-18 NOTE — Discharge Instructions (Addendum)
 You were given a splint, please keep this in place for the next 24 to 48 hours.  You were also given a prescription for anti-inflammatories, please take 1 tablet twice a day for the next 5 days.

## 2024-10-12 ENCOUNTER — Other Ambulatory Visit (HOSPITAL_BASED_OUTPATIENT_CLINIC_OR_DEPARTMENT_OTHER): Payer: Self-pay

## 2024-10-12 MED ORDER — FLUZONE HIGH-DOSE 0.5 ML IM SUSY
0.5000 mL | PREFILLED_SYRINGE | Freq: Once | INTRAMUSCULAR | 0 refills | Status: AC
  Filled 2024-10-12: qty 0.5, 1d supply, fill #0
